# Patient Record
Sex: Female | Born: 1940
Health system: Southern US, Community
[De-identification: ages and names within clinical notes are randomized; demographics above are authoritative.]

## PROBLEM LIST (undated history)

## (undated) DIAGNOSIS — Z86010 Personal history of colonic polyps: Secondary | ICD-10-CM

## (undated) DIAGNOSIS — I10 Essential (primary) hypertension: Secondary | ICD-10-CM

## (undated) DIAGNOSIS — N840 Polyp of corpus uteri: Secondary | ICD-10-CM

## (undated) DIAGNOSIS — M1990 Unspecified osteoarthritis, unspecified site: Secondary | ICD-10-CM

## (undated) DIAGNOSIS — R002 Palpitations: Principal | ICD-10-CM

## (undated) DIAGNOSIS — C50919 Malignant neoplasm of unspecified site of unspecified female breast: Secondary | ICD-10-CM

## (undated) DIAGNOSIS — G4700 Insomnia, unspecified: Secondary | ICD-10-CM

## (undated) DIAGNOSIS — E785 Hyperlipidemia, unspecified: Secondary | ICD-10-CM

## (undated) DIAGNOSIS — Z923 Personal history of irradiation: Secondary | ICD-10-CM

## (undated) DIAGNOSIS — N3281 Overactive bladder: Secondary | ICD-10-CM

## (undated) DIAGNOSIS — M8580 Other specified disorders of bone density and structure, unspecified site: Secondary | ICD-10-CM

## (undated) DIAGNOSIS — N281 Cyst of kidney, acquired: Secondary | ICD-10-CM

## (undated) DIAGNOSIS — N764 Abscess of vulva: Secondary | ICD-10-CM

## (undated) DIAGNOSIS — T7840XA Allergy, unspecified, initial encounter: Secondary | ICD-10-CM

## (undated) HISTORY — DX: Allergy, unspecified, initial encounter: T78.40XA

## (undated) HISTORY — DX: Unspecified osteoarthritis, unspecified site: M19.90

## (undated) HISTORY — DX: Abscess of vulva: N76.4

## (undated) HISTORY — DX: Overactive bladder: N32.81

## (undated) HISTORY — DX: Palpitations: R00.2

## (undated) HISTORY — DX: Malignant neoplasm of unspecified site of unspecified female breast: C50.919

## (undated) HISTORY — DX: Polyp of corpus uteri: N84.0

## (undated) HISTORY — PX: POLYPECTOMY: SHX149

## (undated) HISTORY — DX: Hyperlipidemia, unspecified: E78.5

## (undated) HISTORY — DX: Other specified disorders of bone density and structure, unspecified site: M85.80

## (undated) HISTORY — PX: TONSILLECTOMY AND ADENOIDECTOMY: SUR1326

## (undated) HISTORY — DX: Personal history of colonic polyps: Z86.010

## (undated) HISTORY — DX: Essential (primary) hypertension: I10

## (undated) HISTORY — DX: Insomnia, unspecified: G47.00

## (undated) HISTORY — PX: BREAST BIOPSY: SHX20

## (undated) HISTORY — DX: Cyst of kidney, acquired: N28.1

## (undated) HISTORY — PX: COLONOSCOPY: SHX174

---

## 1992-06-19 DIAGNOSIS — C50919 Malignant neoplasm of unspecified site of unspecified female breast: Secondary | ICD-10-CM

## 1992-06-19 HISTORY — DX: Malignant neoplasm of unspecified site of unspecified female breast: C50.919

## 1992-06-19 HISTORY — PX: BREAST LUMPECTOMY: SHX2

## 1996-03-19 DIAGNOSIS — N764 Abscess of vulva: Secondary | ICD-10-CM

## 1996-03-19 HISTORY — DX: Abscess of vulva: N76.4

## 1998-02-23 ENCOUNTER — Emergency Department (HOSPITAL_COMMUNITY): Admission: EM | Admit: 1998-02-23 | Discharge: 1998-02-23 | Payer: Self-pay | Admitting: Emergency Medicine

## 1998-11-08 ENCOUNTER — Other Ambulatory Visit: Admission: RE | Admit: 1998-11-08 | Discharge: 1998-11-08 | Payer: Self-pay | Admitting: Obstetrics and Gynecology

## 1999-11-08 ENCOUNTER — Other Ambulatory Visit: Admission: RE | Admit: 1999-11-08 | Discharge: 1999-11-08 | Payer: Self-pay | Admitting: Obstetrics and Gynecology

## 2000-04-04 ENCOUNTER — Encounter (INDEPENDENT_AMBULATORY_CARE_PROVIDER_SITE_OTHER): Payer: Self-pay | Admitting: Specialist

## 2000-04-04 ENCOUNTER — Other Ambulatory Visit: Admission: RE | Admit: 2000-04-04 | Discharge: 2000-04-04 | Payer: Self-pay | Admitting: Obstetrics and Gynecology

## 2000-05-29 ENCOUNTER — Ambulatory Visit (HOSPITAL_COMMUNITY): Admission: RE | Admit: 2000-05-29 | Discharge: 2000-05-29 | Payer: Self-pay | Admitting: Obstetrics and Gynecology

## 2000-05-29 ENCOUNTER — Encounter: Payer: Self-pay | Admitting: Obstetrics and Gynecology

## 2000-08-17 DIAGNOSIS — N840 Polyp of corpus uteri: Secondary | ICD-10-CM

## 2000-08-17 HISTORY — DX: Polyp of corpus uteri: N84.0

## 2000-09-17 HISTORY — PX: HYSTEROSCOPY: SHX211

## 2000-09-17 HISTORY — PX: OTHER SURGICAL HISTORY: SHX169

## 2000-09-25 ENCOUNTER — Encounter (INDEPENDENT_AMBULATORY_CARE_PROVIDER_SITE_OTHER): Payer: Self-pay

## 2000-09-25 ENCOUNTER — Ambulatory Visit (HOSPITAL_COMMUNITY): Admission: RE | Admit: 2000-09-25 | Discharge: 2000-09-25 | Payer: Self-pay | Admitting: Obstetrics and Gynecology

## 2000-11-13 ENCOUNTER — Other Ambulatory Visit: Admission: RE | Admit: 2000-11-13 | Discharge: 2000-11-13 | Payer: Self-pay | Admitting: Obstetrics and Gynecology

## 2001-03-22 ENCOUNTER — Encounter: Payer: Self-pay | Admitting: Oncology

## 2001-03-22 ENCOUNTER — Ambulatory Visit (HOSPITAL_COMMUNITY): Admission: RE | Admit: 2001-03-22 | Discharge: 2001-03-22 | Payer: Self-pay | Admitting: Oncology

## 2001-05-30 ENCOUNTER — Ambulatory Visit (HOSPITAL_COMMUNITY): Admission: RE | Admit: 2001-05-30 | Discharge: 2001-05-30 | Payer: Self-pay | Admitting: Oncology

## 2001-05-30 ENCOUNTER — Encounter: Payer: Self-pay | Admitting: Oncology

## 2001-06-19 DIAGNOSIS — N281 Cyst of kidney, acquired: Secondary | ICD-10-CM

## 2001-06-19 HISTORY — DX: Cyst of kidney, acquired: N28.1

## 2001-07-25 ENCOUNTER — Encounter: Payer: Self-pay | Admitting: Internal Medicine

## 2002-01-09 ENCOUNTER — Other Ambulatory Visit: Admission: RE | Admit: 2002-01-09 | Discharge: 2002-01-09 | Payer: Self-pay | Admitting: Obstetrics and Gynecology

## 2002-04-16 ENCOUNTER — Encounter: Payer: Self-pay | Admitting: Oncology

## 2002-04-16 ENCOUNTER — Ambulatory Visit (HOSPITAL_COMMUNITY): Admission: RE | Admit: 2002-04-16 | Discharge: 2002-04-16 | Payer: Self-pay | Admitting: Oncology

## 2002-04-16 ENCOUNTER — Encounter (INDEPENDENT_AMBULATORY_CARE_PROVIDER_SITE_OTHER): Payer: Self-pay | Admitting: *Deleted

## 2002-06-02 ENCOUNTER — Encounter: Payer: Self-pay | Admitting: Oncology

## 2002-06-02 ENCOUNTER — Ambulatory Visit (HOSPITAL_COMMUNITY): Admission: RE | Admit: 2002-06-02 | Discharge: 2002-06-02 | Payer: Self-pay | Admitting: Oncology

## 2003-03-04 ENCOUNTER — Encounter: Admission: RE | Admit: 2003-03-04 | Discharge: 2003-03-04 | Payer: Self-pay | Admitting: Obstetrics and Gynecology

## 2003-03-04 ENCOUNTER — Encounter: Payer: Self-pay | Admitting: Obstetrics and Gynecology

## 2003-03-04 ENCOUNTER — Other Ambulatory Visit: Admission: RE | Admit: 2003-03-04 | Discharge: 2003-03-04 | Payer: Self-pay | Admitting: Obstetrics and Gynecology

## 2004-03-24 ENCOUNTER — Encounter: Admission: RE | Admit: 2004-03-24 | Discharge: 2004-03-24 | Payer: Self-pay | Admitting: Obstetrics and Gynecology

## 2004-06-03 ENCOUNTER — Other Ambulatory Visit: Admission: RE | Admit: 2004-06-03 | Discharge: 2004-06-03 | Payer: Self-pay | Admitting: Obstetrics and Gynecology

## 2004-08-17 ENCOUNTER — Ambulatory Visit: Payer: Self-pay | Admitting: Internal Medicine

## 2004-10-18 ENCOUNTER — Ambulatory Visit: Payer: Self-pay | Admitting: Internal Medicine

## 2004-10-27 ENCOUNTER — Ambulatory Visit: Payer: Self-pay | Admitting: Internal Medicine

## 2005-03-28 ENCOUNTER — Encounter: Admission: RE | Admit: 2005-03-28 | Discharge: 2005-03-28 | Payer: Self-pay | Admitting: Obstetrics and Gynecology

## 2005-08-01 ENCOUNTER — Other Ambulatory Visit: Admission: RE | Admit: 2005-08-01 | Discharge: 2005-08-01 | Payer: Self-pay | Admitting: Obstetrics & Gynecology

## 2006-04-06 ENCOUNTER — Encounter: Admission: RE | Admit: 2006-04-06 | Discharge: 2006-04-06 | Payer: Self-pay | Admitting: Obstetrics & Gynecology

## 2007-04-09 ENCOUNTER — Encounter: Admission: RE | Admit: 2007-04-09 | Discharge: 2007-04-09 | Payer: Self-pay | Admitting: Oncology

## 2007-04-24 ENCOUNTER — Other Ambulatory Visit: Admission: RE | Admit: 2007-04-24 | Discharge: 2007-04-24 | Payer: Self-pay | Admitting: Obstetrics & Gynecology

## 2007-04-29 ENCOUNTER — Encounter: Admission: RE | Admit: 2007-04-29 | Discharge: 2007-04-29 | Payer: Self-pay | Admitting: Obstetrics & Gynecology

## 2007-08-12 ENCOUNTER — Ambulatory Visit: Payer: Self-pay | Admitting: Internal Medicine

## 2007-08-20 ENCOUNTER — Ambulatory Visit: Payer: Self-pay | Admitting: Internal Medicine

## 2008-04-28 ENCOUNTER — Encounter: Admission: RE | Admit: 2008-04-28 | Discharge: 2008-04-28 | Payer: Self-pay | Admitting: Oncology

## 2009-02-18 ENCOUNTER — Telehealth: Payer: Self-pay | Admitting: Internal Medicine

## 2009-03-01 ENCOUNTER — Ambulatory Visit: Payer: Self-pay | Admitting: Internal Medicine

## 2009-03-01 DIAGNOSIS — C50919 Malignant neoplasm of unspecified site of unspecified female breast: Secondary | ICD-10-CM | POA: Insufficient documentation

## 2009-03-02 ENCOUNTER — Encounter: Payer: Self-pay | Admitting: Internal Medicine

## 2009-03-02 ENCOUNTER — Ambulatory Visit: Payer: Self-pay | Admitting: Internal Medicine

## 2009-03-04 ENCOUNTER — Encounter: Payer: Self-pay | Admitting: Internal Medicine

## 2009-05-03 ENCOUNTER — Encounter: Admission: RE | Admit: 2009-05-03 | Discharge: 2009-05-03 | Payer: Self-pay | Admitting: Oncology

## 2009-11-09 ENCOUNTER — Encounter: Admission: RE | Admit: 2009-11-09 | Discharge: 2009-11-09 | Payer: Self-pay | Admitting: Cardiology

## 2010-05-17 ENCOUNTER — Encounter: Admission: RE | Admit: 2010-05-17 | Discharge: 2010-05-17 | Payer: Self-pay | Admitting: Oncology

## 2010-06-06 ENCOUNTER — Ambulatory Visit: Payer: Self-pay | Admitting: Cardiology

## 2010-07-09 ENCOUNTER — Encounter: Payer: Self-pay | Admitting: Oncology

## 2010-10-07 ENCOUNTER — Other Ambulatory Visit: Payer: Self-pay

## 2010-11-04 NOTE — Op Note (Signed)
[facility]  Patient:    Jasmine Payne, Jasmine Payne                MRN: [id number] Proc. Date: 09/25/00 Adm. Date:  [id number] Attending:  Jenean Lindau                           Operative Report  PREOPERATIVE DIAGNOSIS:  Postmenopausal bleeding due to endometrial polyp.  POSTOPERATIVE DIAGNOSIS:  Postmenopausal bleeding due to endometrial polyp.  PROCEDURE:  Hysteroscopic resection.  SURGEON:  Laqueta Linden, M.D.  ANESTHESIA:  General endotracheal.  ESTIMATED BLOOD LOSS:  Less than 20 cc.  SORBITOL NET INTAKE:  120 cc.  COMPLICATIONS:  None.  INDICATIONS:  The patient is a 70 year old menopausal female with the recent onset of postmenopausal bleeding.  She is status post five years of Tamoxifen as chemotherapy after her lumpectomy for breast cancer.  She stopped her Tamoxifen in 1999.  She has been on no hormonal replacement therapy since. She underwent pelvic ultrasound and sonohistogram revealing a 1.7 cm endometrial polyp with an otherwise then endometrium.  She is therefore to undergo hysteroscope resection.  She has been extensively counseled as to the risks, benefits, alternatives, and complications, and agrees to proceed.  Full consent had been given.  DESCRIPTION OF PROCEDURE:  The patient was taken to the operating room and after proper identification and consents were ascertained, she was placed on the operating table in the supine position.  After the induction of general endotracheal anesthesia, she was placed in the [place] stirrups, and the perineum and vagina now were prepped and draped in a routine sterile fashion. A transurethral Foley was placed which was removed at the conclusion of the procedure.  Bimanual examination confirmed a retroverted normal size uterus. A speculum was placed in the vagina and the anterior lip of the cervix grasped with a single tooth tenaculum.  The uterine cavity sounded to 9 cm.  The internal os was  gently dilated to a #33 Pratt dilator.  The hysteroscope was video and continuous sorbitol infusion was then inserted.  The endocervical canal was free of lesions.  The endometrial cavity was atrophic with both tubal ostia well-visualized.  There was a large broad-based top protruding from the right aspect of the fundus.  The resectoscope was placed on routine settings and the polyp was systematically resected in multiple pieces which were evacuated and sent to pathology.  Several small bleeding points were cauterized.  Pre and post resection photographs were taken.  Sharp curettage was performed at the conclusion of the procedure and no tissue was obtained consistent with the patients extremely atrophic endometrium. Polyp pieces were sent to pathology.  All instruments were removed.  The tenaculum site was hemostatic.  Net sorbitol intake was 120 cc.  The patient was stable on transfer to the recovery room.  She will be observed at discharge per anesthesia protocol.  She is to follow up in the office for her annual exam in the end of May, and will reassess her progress at that point.  She is to call prior to her scheduled follow up for excessive pain, fever, bleeding, or other concerns.  She is to take her routine medications, and Advil or Aleve as needed for any cramping. DD:  09/25/00 TD:  09/25/00 Job: [id number]

## 2011-03-02 ENCOUNTER — Encounter: Payer: Self-pay | Admitting: *Deleted

## 2011-03-02 DIAGNOSIS — C50919 Malignant neoplasm of unspecified site of unspecified female breast: Secondary | ICD-10-CM | POA: Insufficient documentation

## 2011-03-02 DIAGNOSIS — C801 Malignant (primary) neoplasm, unspecified: Secondary | ICD-10-CM | POA: Insufficient documentation

## 2011-03-13 ENCOUNTER — Ambulatory Visit: Payer: Self-pay | Admitting: Cardiology

## 2011-03-13 ENCOUNTER — Other Ambulatory Visit: Payer: Self-pay | Admitting: *Deleted

## 2011-03-17 ENCOUNTER — Encounter: Payer: Self-pay | Admitting: Cardiology

## 2011-04-11 ENCOUNTER — Other Ambulatory Visit: Payer: Self-pay | Admitting: Oncology

## 2011-04-11 DIAGNOSIS — Z1231 Encounter for screening mammogram for malignant neoplasm of breast: Secondary | ICD-10-CM

## 2011-05-19 ENCOUNTER — Ambulatory Visit
Admission: RE | Admit: 2011-05-19 | Discharge: 2011-05-19 | Disposition: A | Discharge status: Home / Self Care | Payer: Medicare Other | Source: Ambulatory Visit | Attending: Oncology | Admitting: Oncology

## 2011-05-19 DIAGNOSIS — Z1231 Encounter for screening mammogram for malignant neoplasm of breast: Secondary | ICD-10-CM

## 2011-10-16 ENCOUNTER — Other Ambulatory Visit: Payer: Self-pay | Admitting: Dermatology

## 2011-12-04 ENCOUNTER — Other Ambulatory Visit: Payer: Self-pay | Admitting: Dermatology

## 2011-12-19 ENCOUNTER — Other Ambulatory Visit: Payer: Self-pay

## 2011-12-19 ENCOUNTER — Ambulatory Visit: Payer: Medicare Other | Admitting: Cardiology

## 2012-01-30 ENCOUNTER — Encounter: Payer: Self-pay | Admitting: Cardiology

## 2012-01-30 ENCOUNTER — Ambulatory Visit (INDEPENDENT_AMBULATORY_CARE_PROVIDER_SITE_OTHER): Payer: Medicare Other | Admitting: Cardiology

## 2012-01-30 ENCOUNTER — Other Ambulatory Visit (INDEPENDENT_AMBULATORY_CARE_PROVIDER_SITE_OTHER): Payer: Medicare Other

## 2012-01-30 VITALS — BP 122/64 | HR 57 | Ht 67.0 in | Wt 140.8 lb

## 2012-01-30 DIAGNOSIS — R0989 Other specified symptoms and signs involving the circulatory and respiratory systems: Secondary | ICD-10-CM

## 2012-01-30 DIAGNOSIS — E7800 Pure hypercholesterolemia, unspecified: Secondary | ICD-10-CM

## 2012-01-30 DIAGNOSIS — R079 Chest pain, unspecified: Secondary | ICD-10-CM

## 2012-01-30 LAB — LIPID PANEL
Cholesterol: 219 mg/dL — ABNORMAL HIGH (ref 0–200)
HDL: 79.1 mg/dL (ref >39.00)
Total CHOL/HDL Ratio: 3
Triglycerides: 70 mg/dL (ref 0.0–149.0)
VLDL: 14 mg/dL (ref 0.0–40.0)

## 2012-01-30 LAB — HEPATIC FUNCTION PANEL
ALT: 23 U/L (ref 0–35)
AST: 23 U/L (ref 0–37)
Albumin: 4.3 g/dL (ref 3.5–5.2)
Alkaline Phosphatase: 33 U/L — ABNORMAL LOW (ref 39–117)
Bilirubin, Direct: 0.2 mg/dL (ref 0.0–0.3)
Total Bilirubin: 1.5 mg/dL — ABNORMAL HIGH (ref 0.3–1.2)
Total Protein: 6.7 g/dL (ref 6.0–8.3)

## 2012-01-30 LAB — BASIC METABOLIC PANEL
BUN: 11 mg/dL (ref 6–23)
CO2: 22 mEq/L (ref 19–32)
Calcium: 8.9 mg/dL (ref 8.4–10.5)
Chloride: 104 mEq/L (ref 96–112)
Creatinine, Ser: 0.6 mg/dL (ref 0.4–1.2)
GFR: 104.81 mL/min (ref >60.00)
Glucose, Bld: 86 mg/dL (ref 70–99)
Potassium: 4.1 mEq/L (ref 3.5–5.1)
Sodium: 136 mEq/L (ref 135–145)

## 2012-01-30 LAB — LDL CHOLESTEROL, DIRECT: Direct LDL: 128.3 mg/dL

## 2012-01-30 NOTE — Progress Notes (Signed)
Jasmine Payne Date of Birth:  1940-12-25 [facility] [address] [place], [place]  [address] [phone]  Fax   [phone]  HPI: This pleasant 71 year old woman is seen after a long absence she has a history of atypical chest pain and a history of hypercholesterolemia.  We last saw her here in May 2011 when she came to the office self-referred.  We had seen her many years earlier but those records are no longer available.  In 2000 111 she was having some left-sided chest pain and a chest x-ray was unremarkable and she had a one-day treadmill Cardiolite stress test which was normal.  Current Outpatient Prescriptions  Medication Sig Dispense Refill  . Multiple Vitamin (MULTIVITAMIN) tablet Take 1 tablet by mouth daily.        . Red Yeast Rice Extract (RED YEAST RICE PO) Take by mouth 2 (two) times daily.        Allergies  Allergen Reactions  . Codeine     REACTION: Nausea    Patient Active Problem List  Diagnosis  . ADENOCARCINOMA, LEFT BREAST  . Cancer  . Breast cancer  . Pure hypercholesterolemia    History  Smoking status  . Former Smoker  Smokeless tobacco  . Not on file    History  Alcohol Use     Family History  Problem Relation Age of Onset  . Cancer Father     esophageal    Review of Systems: The patient denies any heat or cold intolerance.  No weight gain or weight loss.  The patient denies headaches or blurry vision.  There is no cough or sputum production.  The patient denies dizziness.  There is no hematuria or hematochezia.  The patient denies any muscle aches or arthritis.  The patient denies any rash.  The patient denies frequent falling or instability.  There is no history of depression or anxiety.  All other systems were reviewed and are negative.   Physical Exam: Filed Vitals:   01/30/12 0955  BP: 122/64  Pulse: 57   the general appearance feels a well-developed well-nourished woman in no distress.The head and neck  exam reveals pupils equal and reactive.  Extraocular movements are full.  There is no scleral icterus.  The mouth and pharynx are normal.  The neck is supple.  The carotids reveal no bruits.  The jugular venous pressure is normal.  The  thyroid is not enlarged.  There is no lymphadenopathy.  The chest is clear to percussion and auscultation.  There are no rales or rhonchi.  Expansion of the chest is symmetrical.  The precordium is quiet.  The first heart sound is normal.  The second heart sound is physiologically split.  There is no murmur gallop rub or click.  There is no abnormal lift or heave.  The abdomen is soft and nontender.  The bowel sounds are normal.  The liver and spleen are not enlarged.  There are no abdominal masses.  There are no abdominal bruits.  Extremities reveal good pedal pulses.  There is no phlebitis or edema.  There is no cyanosis or clubbing.  Strength is normal and symmetrical in all extremities.  There is no lateralizing weakness.  There are no sensory deficits.  The skin is warm and dry.  There is no rash.  EKG shows sinus bradycardia at 59 per minute with incomplete right bundle branch block and no ischemic changes.    Assessment / Plan: We are checking lab work  today.  She will be going on a trip to [place] in September.  We'll plan to see her in about 3 months for a followup visit and plan to repeat her lab work then.

## 2012-01-30 NOTE — Patient Instructions (Signed)
Will obtain labs today and call you with the results   Your physician recommends that you continue on your current medications as directed. Please refer to the Current Medication list given to you today.  Your physician recommends that you schedule a follow-up appointment in: 3 months with fasting labs (LP/BMET/HFP)

## 2012-01-30 NOTE — Assessment & Plan Note (Signed)
Patient has not been experiencing any recent or recurrent left chest pain.  She's had a previous normal Cardiolite in 2011.  She does have a positive family history of coronary disease with her mother having a heart attack in her 65s but is still alive at 31.  Other risk factors include the fact that the patient previously smoked and she has had hypercholesterolemia previous electrocardiograms have shown incomplete right bundle branch block.

## 2012-01-30 NOTE — Assessment & Plan Note (Signed)
The patient has a past history of hypercholesterolemia.  At the present time she is just taking red yeast rice in low dosage.  She is wondering whether she needs something stronger.  She is fasting today we will get fasting blood work and let her know about it.

## 2012-02-08 ENCOUNTER — Telehealth: Payer: Self-pay | Admitting: Cardiology

## 2012-02-08 DIAGNOSIS — E7800 Pure hypercholesterolemia, unspecified: Secondary | ICD-10-CM

## 2012-02-08 MED ORDER — ATORVASTATIN CALCIUM 10 MG PO TABS: 10.0000 mg | ORAL_TABLET | Freq: Every day | ORAL | Status: DC

## 2012-02-08 NOTE — Telephone Encounter (Signed)
Message copied by Burnell Blanks on Thu Feb 08, 2012  4:51 PM ------      Message from: Cassell Clement      Created: Wed Jan 31, 2012  4:16 PM       Please report.  The cholesterol is 219 in the LDL cholesterol which is the bad cholesterol is elevated at 128.  Her liver function studies are normal and her kidney function studies are normal.  I would like her to begin Lipitor and a low-dose of 10 mg daily.  We will plan to have her return in about 3 months for followup office visit and fasting lab work.

## 2012-02-08 NOTE — Telephone Encounter (Signed)
Advised patient

## 2012-02-08 NOTE — Telephone Encounter (Signed)
Pt would like lab results from last visit.

## 2012-04-08 ENCOUNTER — Other Ambulatory Visit: Payer: Self-pay | Admitting: Oncology

## 2012-04-08 DIAGNOSIS — Z1231 Encounter for screening mammogram for malignant neoplasm of breast: Secondary | ICD-10-CM

## 2012-05-01 ENCOUNTER — Ambulatory Visit (INDEPENDENT_AMBULATORY_CARE_PROVIDER_SITE_OTHER): Payer: Medicare Other | Admitting: Cardiology

## 2012-05-01 ENCOUNTER — Other Ambulatory Visit (INDEPENDENT_AMBULATORY_CARE_PROVIDER_SITE_OTHER): Payer: Medicare Other

## 2012-05-01 ENCOUNTER — Encounter: Payer: Self-pay | Admitting: Cardiology

## 2012-05-01 VITALS — BP 110/77 | HR 64 | Ht 66.0 in | Wt 143.8 lb

## 2012-05-01 DIAGNOSIS — R0989 Other specified symptoms and signs involving the circulatory and respiratory systems: Secondary | ICD-10-CM

## 2012-05-01 DIAGNOSIS — R079 Chest pain, unspecified: Secondary | ICD-10-CM

## 2012-05-01 DIAGNOSIS — E7800 Pure hypercholesterolemia, unspecified: Secondary | ICD-10-CM

## 2012-05-01 MED ORDER — ATORVASTATIN CALCIUM 10 MG PO TABS: ORAL_TABLET | ORAL | Status: DC

## 2012-05-01 NOTE — Assessment & Plan Note (Signed)
Patient has not been expressing any chest pain.  She has had some occasional sinus headaches relieved by aspirin.  She will have occasional migraine-like headaches associated with visual change.  She has not been experiencing any high blood pressure problems

## 2012-05-01 NOTE — Assessment & Plan Note (Signed)
She has hypercholesterolemia.  She had not actually started Lipitor until just a week ago.  She is taking 10 mg daily.  We will cancel the labs which we had planned to obtain today.  She would like to start taking Lipitor in a low dose of 10 mg every other day.  We will do that and then plan to recheck her in 3 months and get lab work before that next visit.

## 2012-05-01 NOTE — Patient Instructions (Addendum)
Your physician recommends that you schedule a follow-up appointment in: 3 months   Your physician recommends that you return for a FASTING lipid profile: 3 months   Your physician has recommended you make the following change in your medication:  lipitor 10 mg every other day.

## 2012-05-01 NOTE — Progress Notes (Signed)
Jasmine Payne Date of Birth:  [birth year] [facility] [address] [place], [place]  [address] [phone]  Fax   [phone]  HPI: This pleasant 71 year old woman is seen for a scheduled 3 month followup office visit .she has a history of atypical chest pain and a history of hypercholesterolemia. . We had seen her many years earlier but those records are no longer available. In 2000 111 she was having some left-sided chest pain and a chest x-ray was unremarkable and she had a one-day treadmill Cardiolite stress test which was normal.  On her last visit in August 2013 her lipids were elevated and we recommended starting Lipitor 10 mg a day.   Current Outpatient Prescriptions  Medication Sig Dispense Refill  . atorvastatin (LIPITOR) 10 MG tablet 10 mg every other day  30 tablet  5  . [DISCONTINUED] atorvastatin (LIPITOR) 10 MG tablet Take 1 tablet (10 mg total) by mouth daily.  30 tablet  5    Allergies  Allergen Reactions  . Codeine     REACTION: Nausea    Patient Active Problem List  Diagnosis  . ADENOCARCINOMA, LEFT BREAST  . Cancer  . Breast cancer  . Pure hypercholesterolemia  . Chest pain    History  Smoking status  . Former Smoker  Smokeless tobacco  . Not on file    History  Alcohol Use     Family History  Problem Relation Age of Onset  . Cancer Father     esophageal    Review of Systems: The patient denies any heat or cold intolerance.  No weight gain or weight loss.  The patient denies headaches or blurry vision.  There is no cough or sputum production.  The patient denies dizziness.  There is no hematuria or hematochezia.  The patient denies any muscle aches or arthritis.  The patient denies any rash.  The patient denies frequent falling or instability.  There is no history of depression or anxiety.  All other systems were reviewed and are negative.   Physical Exam: Filed Vitals:   05/01/12 1006  BP: 110/77  Pulse: 64   the  general appearance reveals a well-developed well-nourished woman in no distress.The head and neck exam reveals pupils equal and reactive.  Extraocular movements are full.  There is no scleral icterus.  The mouth and pharynx are normal.  The neck is supple.  The carotids reveal no bruits.  The jugular venous pressure is normal.  The  thyroid is not enlarged.  There is no lymphadenopathy.  The chest is clear to percussion and auscultation.  There are no rales or rhonchi.  Expansion of the chest is symmetrical.  The precordium is quiet.  The first heart sound is normal.  The second heart sound is physiologically split.  There is no murmur gallop rub or click.  There is no abnormal lift or heave.  The abdomen is soft and nontender.  The bowel sounds are normal.  The liver and spleen are not enlarged.  There are no abdominal masses.  There are no abdominal bruits.  Extremities reveal good pedal pulses.  There is no phlebitis or edema.  There is no cyanosis or clubbing.  Strength is normal and symmetrical in all extremities.  There is no lateralizing weakness.  There are no sensory deficits.  The skin is warm and dry.  There is no rash.      Assessment / Plan: Start Lipitor 10 mg every other day, intentionally low  at her request, and recheck in 3 months.  Get lab work before next visit

## 2012-05-20 ENCOUNTER — Ambulatory Visit
Admission: RE | Admit: 2012-05-20 | Discharge: 2012-05-20 | Disposition: A | Discharge status: Home / Self Care | Payer: Medicare Other | Source: Ambulatory Visit | Attending: Oncology | Admitting: Oncology

## 2012-05-20 DIAGNOSIS — Z1231 Encounter for screening mammogram for malignant neoplasm of breast: Secondary | ICD-10-CM

## 2012-07-29 ENCOUNTER — Other Ambulatory Visit: Payer: Medicare Other

## 2012-07-31 ENCOUNTER — Ambulatory Visit: Payer: Medicare Other | Admitting: Cardiology

## 2012-09-26 ENCOUNTER — Other Ambulatory Visit: Payer: Self-pay

## 2012-09-26 ENCOUNTER — Other Ambulatory Visit: Payer: Self-pay | Admitting: Obstetrics and Gynecology

## 2012-09-26 DIAGNOSIS — M8580 Other specified disorders of bone density and structure, unspecified site: Secondary | ICD-10-CM

## 2012-10-03 ENCOUNTER — Encounter: Payer: Self-pay | Admitting: *Deleted

## 2012-10-08 ENCOUNTER — Encounter: Payer: Self-pay | Admitting: Internal Medicine

## 2012-10-14 ENCOUNTER — Telehealth: Payer: Self-pay | Admitting: *Deleted

## 2012-10-14 ENCOUNTER — Other Ambulatory Visit: Payer: Self-pay | Admitting: Obstetrics and Gynecology

## 2012-10-14 ENCOUNTER — Ambulatory Visit: Payer: Medicare Other | Admitting: Internal Medicine

## 2012-10-14 DIAGNOSIS — Z01419 Encounter for gynecological examination (general) (routine) without abnormal findings: Secondary | ICD-10-CM | POA: Insufficient documentation

## 2012-10-14 NOTE — Telephone Encounter (Signed)
Jasmine Payne at  [facility] needing  Dr. Edward Jolly to sign ? Order in EPIC ? For Dexa Scan to be done tomorrow/ sue

## 2012-10-15 ENCOUNTER — Ambulatory Visit
Admission: RE | Admit: 2012-10-15 | Discharge: 2012-10-15 | Disposition: A | Discharge status: Home / Self Care | Payer: Medicare Other | Source: Ambulatory Visit | Attending: Obstetrics and Gynecology | Admitting: Obstetrics and Gynecology

## 2012-10-15 DIAGNOSIS — M8580 Other specified disorders of bone density and structure, unspecified site: Secondary | ICD-10-CM

## 2012-11-08 ENCOUNTER — Telehealth: Payer: Self-pay

## 2012-11-08 NOTE — Telephone Encounter (Signed)
5/23 [email or web address]//kn

## 2012-11-19 NOTE — Telephone Encounter (Signed)
Patient notified of BMD results. 

## 2013-01-23 ENCOUNTER — Ambulatory Visit: Payer: Self-pay | Admitting: Certified Nurse Midwife

## 2013-04-12 ENCOUNTER — Other Ambulatory Visit: Payer: Self-pay | Admitting: Obstetrics & Gynecology

## 2013-04-14 NOTE — Telephone Encounter (Signed)
Hi Joy,  I will approve the Ambien 10 mg, one po, q hs, prn.  Dispense #30.  Refill zero. I looks like you put in the order already.  Thanks!  [employer]

## 2013-04-14 NOTE — Telephone Encounter (Signed)
aex was 07-04-12 & no rx was given at that time. Last given 12-25-11 #30 with 2 refills. Her chart is in your door.Please approve or deny rx

## 2013-04-15 NOTE — Telephone Encounter (Signed)
Rx Printed will be faxed to Pharmacy

## 2013-04-21 ENCOUNTER — Other Ambulatory Visit: Payer: Self-pay

## 2013-04-21 DIAGNOSIS — Z1231 Encounter for screening mammogram for malignant neoplasm of breast: Secondary | ICD-10-CM

## 2013-05-22 ENCOUNTER — Ambulatory Visit
Admission: RE | Admit: 2013-05-22 | Discharge: 2013-05-22 | Disposition: A | Discharge status: Home / Self Care | Payer: Medicare Other | Source: Ambulatory Visit

## 2013-05-22 DIAGNOSIS — Z1231 Encounter for screening mammogram for malignant neoplasm of breast: Secondary | ICD-10-CM

## 2013-09-25 ENCOUNTER — Ambulatory Visit: Payer: Self-pay | Admitting: Obstetrics & Gynecology

## 2013-10-01 ENCOUNTER — Encounter: Payer: Self-pay | Admitting: Internal Medicine

## 2013-11-28 ENCOUNTER — Encounter: Payer: Self-pay | Admitting: Obstetrics & Gynecology

## 2013-12-01 ENCOUNTER — Telehealth: Payer: Self-pay | Admitting: Obstetrics & Gynecology

## 2013-12-01 ENCOUNTER — Ambulatory Visit: Payer: Self-pay | Admitting: Obstetrics & Gynecology

## 2013-12-01 NOTE — Telephone Encounter (Signed)
Patient called to cancel her AEX for today with Dr. Sabra Heck due to an emergency with her mom. Patient rescheduled for 12/18/13 with Dr. Sabra Heck. FYI only--no charge for missed appointment.

## 2013-12-18 ENCOUNTER — Encounter: Payer: Self-pay | Admitting: Obstetrics & Gynecology

## 2013-12-18 ENCOUNTER — Ambulatory Visit (INDEPENDENT_AMBULATORY_CARE_PROVIDER_SITE_OTHER): Payer: Medicare Other | Admitting: Obstetrics & Gynecology

## 2013-12-18 VITALS — BP 106/64 | HR 68 | Resp 20 | Ht 65.75 in | Wt 161.6 lb

## 2013-12-18 DIAGNOSIS — Z01419 Encounter for gynecological examination (general) (routine) without abnormal findings: Secondary | ICD-10-CM

## 2013-12-18 DIAGNOSIS — Z124 Encounter for screening for malignant neoplasm of cervix: Secondary | ICD-10-CM

## 2013-12-18 DIAGNOSIS — Z1211 Encounter for screening for malignant neoplasm of colon: Secondary | ICD-10-CM

## 2013-12-18 MED ORDER — ZOLPIDEM TARTRATE 10 MG PO TABS: 5.0000 mg | ORAL_TABLET | Freq: Every day | ORAL | Status: DC

## 2013-12-18 NOTE — Patient Instructions (Signed)

## 2013-12-18 NOTE — Progress Notes (Signed)
73 y.o. G2P2 DivorcedCaucasianF here for annual exam.  No vaginal bleeding.  Doing really well.  Dr. Virgina Jock is her PCP.  Working on weight loss.  Cholesterol is elevated.  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   Patient's last menstrual period was 06/19/1992.          Sexually active: No.  The current method of family planning is post menopausal status.    Exercising: Yes.    walking Smoker:  Former smoker  Health Maintenance: Pap:  04/18/10-WNL History of abnormal Pap:  no MMG:  05/22/13-normal Colonoscopy:  2008-repeat in 5 years, patient aware overdue BMD:   09/26/12, -1.7 worse t score TDaP:  2010 Screening Labs: PCP, Hb today: PCP, Urine today: PCP   reports that she has quit smoking. She has never used smokeless tobacco. She reports that she does not drink alcohol or use illicit drugs.  Past Medical History  Diagnosis Date  . Breast cancer 1994    left breast, lumpectomy/negative nodes/tamoxifen x 5 years  . Hyperlipidemia   . Vulvar abscess 10/97  . Hypertension   . Endometrial polyp 3/02  . Kidney cysts 2003    benign, fatty cyst/angiomyolipoma  . Osteopenia   . OAB (overactive bladder)   . Insomnia     Past Surgical History  Procedure Laterality Date  . Breast lumpectomy  1994    left side,radiation also  . Breast biopsy      right breast-benign growth  . Tonsillectomy and adenoidectomy    . Hysteroscopic resection  4/02    Current Outpatient Prescriptions  Medication Sig Dispense Refill  . Coenzyme Q10 (COQ10 PO) Take by mouth daily.      . Red Yeast Rice Extract (RED YEAST RICE PO) Take by mouth daily.      Marland Kitchen zolpidem (AMBIEN) 10 MG tablet TAKE 1 TABLET BY MOUTH AT BEDTIME AS NEEDED  30 tablet  0  . atorvastatin (LIPITOR) 10 MG tablet 10 mg every other day  30 tablet  5   No current facility-administered medications for this visit.    Family History  Problem Relation Age of Onset  . Esophageal cancer Father   . Ovarian cancer Sister   .  Colon cancer Mother   . Heart attack Sister   . Colon cancer Paternal Grandfather     ROS:  Pertinent items are noted in HPI.  Otherwise, a comprehensive ROS was negative.  Exam:   BP 106/64  Pulse 68  Resp 20  Ht 5' 5.75" (1.67 m)  Wt 161 lb 9.6 oz (73.301 kg)  BMI 26.28 kg/m2  LMP 06/19/1992  Weight change: +11#   Height: 5' 5.75" (167 cm)  Ht Readings from Last 3 Encounters:  12/18/13 5' 5.75" (1.67 m)  05/01/12 5\' 6"  (1.676 m)  01/30/12 5\' 7"  (1.702 m)    General appearance: alert, cooperative and appears stated age Head: Normocephalic, without obvious abnormality, atraumatic Neck: no adenopathy, supple, symmetrical, trachea midline and thyroid normal to inspection and palpation Lungs: clear to auscultation bilaterally Breasts: normal appearance, no masses or tenderness Heart: regular rate and rhythm Abdomen: soft, non-tender; bowel sounds normal; no masses,  no organomegaly Extremities: extremities normal, atraumatic, no cyanosis or edema Skin: Skin color, texture, turgor normal. No rashes or lesions Lymph nodes: Cervical, supraclavicular, and axillary nodes normal. No abnormal inguinal nodes palpated Neurologic: Grossly normal   Pelvic: External genitalia:  no lesions  Urethra:  normal appearing urethra with no masses, tenderness or lesions              Bartholins and Skenes: normal                 Vagina: normal appearing vagina with normal color and discharge, no lesions              Cervix: no lesions              Pap taken: Yes.   Bimanual Exam:  Uterus:  normal size, contour, position, consistency, mobility, non-tender              Adnexa: normal adnexa and no mass, fullness, tenderness               Rectovaginal: Confirms               Anus:  normal sphincter tone, no lesions  A:  Well Woman with normal exam H/O Breast cancer 1994--Lobar cancer Osteopenia OAB Insomnia  P:   Mammogram yearly.  3D recommended. pap smear today Colonoscopy  overdue.  Referral to Dr. Carlean Purl. Ambien 5mg  nightly prn.  #30/2RF.  Pt never calls to ask for RFs. Labs with Dr. Virgina Jock return annually or prn  An After Visit Summary was printed and given to the patient.

## 2013-12-23 ENCOUNTER — Telehealth: Payer: Self-pay | Admitting: Internal Medicine

## 2013-12-23 LAB — IPS PAP SMEAR ONLY

## 2013-12-23 NOTE — Telephone Encounter (Signed)
Patient is asking to switch from Dr. Olevia Perches to Dr. Carlean Purl. 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 Andreas Blower recommended Dr. Carlean Purl to her. Is this ok Dr. Olevia Perches?

## 2013-12-23 NOTE — Telephone Encounter (Signed)
OK to switch, she received colon recall letter  On 09/2013

## 2013-12-24 NOTE — Telephone Encounter (Signed)
Jasmine Payne, patient can switch to Dr. Carlean Purl. Both MD's have agreed. She can schedule with Dr. Carlean Purl.

## 2013-12-24 NOTE — Telephone Encounter (Signed)
OK 

## 2013-12-24 NOTE — Telephone Encounter (Signed)
Lm for pt to call to schedule colon with Dr. Carlean Purl

## 2013-12-24 NOTE — Telephone Encounter (Signed)
Patient wants to switch from Dr. Olevia Perches to Dr. Carlean Purl.  Dr. Olevia Perches states this is ok. Dr. Carlean Purl, will you accept patient?

## 2013-12-29 ENCOUNTER — Encounter: Payer: Self-pay | Admitting: Internal Medicine

## 2014-03-18 ENCOUNTER — Encounter: Payer: Medicare Other | Admitting: Internal Medicine

## 2014-04-10 ENCOUNTER — Ambulatory Visit (AMBULATORY_SURGERY_CENTER): Payer: Self-pay | Admitting: *Deleted

## 2014-04-10 VITALS — Ht 65.5 in | Wt 157.0 lb

## 2014-04-10 DIAGNOSIS — Z800 Family history of malignant neoplasm of digestive organs: Secondary | ICD-10-CM

## 2014-04-10 NOTE — Progress Notes (Signed)
Patient denies any allergies to eggs or soy. Patient denies any problems with anesthesia/sedation. Patient denies any oxygen use at home and does not take any diet/weight loss medications. EMMI education assisgned to patient on colonoscopy, this was explained and instructions given to patient. 

## 2014-04-16 ENCOUNTER — Encounter: Payer: Self-pay | Admitting: Internal Medicine

## 2014-04-22 ENCOUNTER — Other Ambulatory Visit: Payer: Self-pay

## 2014-04-22 DIAGNOSIS — Z1231 Encounter for screening mammogram for malignant neoplasm of breast: Secondary | ICD-10-CM

## 2014-05-07 ENCOUNTER — Encounter: Payer: Self-pay | Admitting: Internal Medicine

## 2014-05-07 ENCOUNTER — Ambulatory Visit (AMBULATORY_SURGERY_CENTER): Payer: Medicare Other | Admitting: Internal Medicine

## 2014-05-07 VITALS — BP 161/70 | HR 56 | Temp 96.8°F | Resp 16 | Ht 65.0 in | Wt 157.0 lb

## 2014-05-07 DIAGNOSIS — K635 Polyp of colon: Secondary | ICD-10-CM

## 2014-05-07 DIAGNOSIS — D123 Benign neoplasm of transverse colon: Secondary | ICD-10-CM

## 2014-05-07 DIAGNOSIS — D122 Benign neoplasm of ascending colon: Secondary | ICD-10-CM

## 2014-05-07 DIAGNOSIS — Z800 Family history of malignant neoplasm of digestive organs: Secondary | ICD-10-CM

## 2014-05-07 DIAGNOSIS — Z86010 Personal history of colonic polyps: Secondary | ICD-10-CM

## 2014-05-07 MED ORDER — SODIUM CHLORIDE 0.9 % IV SOLN: 500.0000 mL | INTRAVENOUS | Status: DC

## 2014-05-07 NOTE — Progress Notes (Signed)
Called to room to assist during endoscopic procedure.  Patient ID and intended procedure confirmed with present staff. Received instructions for my participation in the procedure from the performing physician.  

## 2014-05-07 NOTE — Progress Notes (Signed)
  [facility] Post-op Note  Patient: Jasmine Payne  Procedure(s) Performed: colonoscopy  Patient Location: [facility] - Recovery Area  Anesthesia Type: Deep Sedation/Propofol  Level of Consciousness: awake, oriented and patient cooperative  Airway and Oxygen Therapy: Patient Spontanous Breathing  Post-op Pain: none  Post-op Assessment:  Post-op Vital signs reviewed, Patient's Cardiovascular Status Stable, Respiratory Function Stable, Patent Airway, No signs of Nausea or vomiting and Pain level controlled  Post-op Vital Signs: Reviewed and stable  Complications: No apparent anesthesia complications  Mckenize Mezera E 10:42 AM

## 2014-05-07 NOTE — Patient Instructions (Addendum)
I found and removed 3 tiny polyps that look benign. It can still be reasonable not to repeat any more routine colonoscopies but I will send a letter after pathology reports in.  You also have a condition called diverticulosis - common and not usually a problem. Please read the handout provided.  I appreciate the opportunity to care for you. Gatha Mayer, MD, FACG   YOU HAD AN ENDOSCOPIC PROCEDURE TODAY AT [facility]: Refer to the procedure report that was given to you for any specific questions about what was found during the examination.  If the procedure report does not answer your questions, please call your gastroenterologist to clarify.  If you requested that your care partner not be given the details of your procedure findings, then the procedure report has been included in a sealed envelope for you to review at your convenience later.  YOU SHOULD EXPECT: Some feelings of bloating in the abdomen. Passage of more gas than usual.  Walking can help get rid of the air that was put into your GI tract during the procedure and reduce the bloating. If you had a lower endoscopy (such as a colonoscopy or flexible sigmoidoscopy) you may notice spotting of blood in your stool or on the toilet paper. If you underwent a bowel prep for your procedure, then you may not have a normal bowel movement for a few days.  DIET: Your first meal following the procedure should be a light meal and then it is ok to progress to your normal diet.  A half-sandwich or bowl of soup is an example of a good first meal.  Heavy or fried foods are harder to digest and may make you feel nauseous or bloated.  Likewise meals heavy in dairy and vegetables can cause extra gas to form and this can also increase the bloating.  Drink plenty of fluids but you should avoid alcoholic beverages for 24 hours.  ACTIVITY: Your care partner should take you home directly after the procedure.  You should plan to take it  easy, moving slowly for the rest of the day.  You can resume normal activity the day after the procedure however you should NOT DRIVE or use heavy machinery for 24 hours (because of the sedation medicines used during the test).    SYMPTOMS TO REPORT IMMEDIATELY: A gastroenterologist can be reached at any hour.  During normal business hours, 8:30 AM to 5:00 PM Monday through Friday, call [phone].  After hours and on weekends, please call the GI answering service at [phone] who will take a message and have the physician on call contact you.   Following lower endoscopy (colonoscopy or flexible sigmoidoscopy):  Excessive amounts of blood in the stool  Significant tenderness or worsening of abdominal pains  Swelling of the abdomen that is new, acute  Fever of 100F or higher  FOLLOW UP: If any biopsies were taken you will be contacted by phone or by letter within the next 1-3 weeks.  Call your gastroenterologist if you have not heard about the biopsies in 3 weeks.  Our staff will call the home number listed on your records the next business day following your procedure to check on you and address any questions or concerns that you may have at that time regarding the information given to you following your procedure. This is a courtesy call and so if there is no answer at the home number and we have not heard from you through  the emergency physician on call, we will assume that you have returned to your regular daily activities without incident.  SIGNATURES/CONFIDENTIALITY: You and/or your care partner have signed paperwork which will be entered into your electronic medical record.  These signatures attest to the fact that that the information above on your After Visit Summary has been reviewed and is understood.  Full responsibility of the confidentiality of this discharge information lies with you and/or your care-partner.

## 2014-05-07 NOTE — Op Note (Addendum)
[facility]  [address] [place], [id number]   COLONOSCOPY PROCEDURE REPORT  PATIENT: Jasmine Payne, Jasmine Payne  MR#: [id number] BIRTHDATE: April 02, 1941 , 73  yrs. old GENDER: female ENDOSCOPIST: Gatha Mayer, MD, [facility] PROCEDURE DATE:  05/07/2014 PROCEDURE:   Colonoscopy with biopsy First Screening Colonoscopy - Avg.  risk and is 50 yrs.  old or older - No.  Prior Negative Screening - Now for repeat screening. N/A  History of Adenoma - Now for follow-up colonoscopy & has been > or = to 3 yrs.  Yes hx of adenoma.  Has been 3 or more years since last colonoscopy.  Polyps Removed Today? Yes. ASA CLASS:   Class II INDICATIONS:surveillance colonoscopy based on a history of adenomatous colonic polyp(s) and patient's immediate family history of colon cancer. MEDICATIONS: Propofol 200 mg IV and Monitored anesthesia care  DESCRIPTION OF PROCEDURE:   After the risks benefits and alternatives of the procedure were thoroughly explained, informed consent was obtained.  The digital rectal exam revealed no abnormalities of the rectum.   The LB [id number]  endoscope was introduced through the anus and advanced to the cecum, which was identified by both the appendix and ileocecal valve. No adverse events experienced.   The quality of the prep was excellent, using MiraLax  The instrument was then slowly withdrawn as the colon was fully examined.  COLON FINDINGS: Three sessile polyps ranging from 2 to 69mm in size were found.  Polypectomies were performed with cold forceps.  The resection was complete, the polyp tissue was completely retrieved and sent to histology.   There was diverticulosis noted in the sigmoid colon.   The examination was otherwise normal.  Retroflexed rectal and right colon views revealed no abnormalities. The time to cecum=2 minutes 27 seconds.  Withdrawal time=13 minutes 12 seconds. The scope was withdrawn and the procedure completed. COMPLICATIONS: There were  no immediate complications.  ENDOSCOPIC IMPRESSION: 1.   Three sessile polyps ranging from 2 to 65mm in size were found; polypectomies were performed with cold forceps 2.   Diverticulosis was noted in the sigmoid colon 3.   The examination was otherwise normal - excellent +prep  RECOMMENDATIONS: 1.  Await pathology results 2.  May not need another routine colonoscopy given age.  eSigned:  Gatha Mayer, MD, [facility] 05/07/2014 10:45 AM Revised: 05/07/2014 10:45 AM  cc: The Patient  Edwinna Areola, MD and Shon Baton, MD

## 2014-05-08 ENCOUNTER — Telehealth: Payer: Self-pay | Admitting: *Deleted

## 2014-05-08 NOTE — Telephone Encounter (Signed)
  Follow up Call-  Call back number 05/07/2014  Post procedure Call Back phone  # [phone]  Permission to leave phone message Yes     Patient questions:  Do you have a fever, pain , or abdominal swelling? No. Pain Score  0 *  Have you tolerated food without any problems? Yes.    Have you been able to return to your normal activities? Yes.    Do you have any questions about your discharge instructions: Diet   No. Medications  No. Follow up visit  No.  Do you have questions or concerns about your Care? No.  Actions: * If pain score is 4 or above: No action needed, pain <4.

## 2014-05-11 ENCOUNTER — Other Ambulatory Visit: Payer: Self-pay | Admitting: Internal Medicine

## 2014-05-11 DIAGNOSIS — M8580 Other specified disorders of bone density and structure, unspecified site: Secondary | ICD-10-CM

## 2014-05-12 ENCOUNTER — Encounter: Payer: Self-pay | Admitting: Internal Medicine

## 2014-05-12 DIAGNOSIS — Z860101 Personal history of adenomatous and serrated colon polyps: Secondary | ICD-10-CM

## 2014-05-12 DIAGNOSIS — Z86010 Personal history of colonic polyps: Secondary | ICD-10-CM

## 2014-05-12 HISTORY — DX: Personal history of colonic polyps: Z86.010

## 2014-05-12 HISTORY — DX: Personal history of adenomatous and serrated colon polyps: Z86.0101

## 2014-05-12 NOTE — Progress Notes (Signed)
Quick Note:  2 adenomas and 1 mucosal polyp Consider repeat colonoscopy 2020 (will be 78) ______

## 2014-05-26 ENCOUNTER — Ambulatory Visit
Admission: RE | Admit: 2014-05-26 | Discharge: 2014-05-26 | Disposition: A | Discharge status: Home / Self Care | Payer: 59 | Source: Ambulatory Visit

## 2014-05-26 ENCOUNTER — Encounter (INDEPENDENT_AMBULATORY_CARE_PROVIDER_SITE_OTHER): Payer: Self-pay

## 2014-05-26 DIAGNOSIS — Z1231 Encounter for screening mammogram for malignant neoplasm of breast: Secondary | ICD-10-CM

## 2014-06-05 ENCOUNTER — Telehealth: Payer: Self-pay | Admitting: Obstetrics & Gynecology

## 2014-06-05 NOTE — Telephone Encounter (Signed)
Fine with appt.  Encounter closed.

## 2014-06-05 NOTE — Telephone Encounter (Signed)
Pt says she has seen blood in urine but she is not bleeding right now. Please call to advise.

## 2014-06-05 NOTE — Telephone Encounter (Signed)
Patient calling with request for office visit with Dr Sabra Heck to check ovaries. Patient reports about two weeks ago was traveling home from the beach and had lower pelvic pain and blood in urine. States "it hurt like crazy!" Patient states she had 4 episodes of hematuria. That day she took two doses of cipro (that she had on hand at home) and saw Dr. Virgina Jock the next day. At Dr. Keane Police office, patient reports no UTI but slight blood in urine.  Patient then saw Dr. Diona Fanti and had negative urine culture and ultrasound of kidney. Patient states she has a hx of L Kidney tumor that is benign.  Patient denies any further bleeding other than episode a few weeks ago.  Patient would like Dr. Sabra Heck to check her ovaries at this time to see if any correlation. Patient reports frequent urination but this is not unusual for her. Patient concerned as she states she had an aunt with ovarian cancer.  Offered patient office visit for evaluation. Patient agreeable but declines any earlier than after Christmas. Patient feels "this is not an emergency" and would like to wait until after Christmas to see Dr. Sabra Heck.   Appointment scheduled at patient's request for day 06/16/14 and advised would send to Dr. Sabra Heck for review and would return call if any new instructions from Dr. Sabra Heck.  Advised patient to call back if worsening of symptoms and would like earlier appointment, patient agreeable to this.

## 2014-06-16 ENCOUNTER — Ambulatory Visit (INDEPENDENT_AMBULATORY_CARE_PROVIDER_SITE_OTHER): Payer: Medicare Other | Admitting: Obstetrics & Gynecology

## 2014-06-16 ENCOUNTER — Encounter: Payer: Self-pay | Admitting: Obstetrics & Gynecology

## 2014-06-16 VITALS — BP 116/68 | HR 64 | Resp 20 | Wt 156.6 lb

## 2014-06-16 DIAGNOSIS — N939 Abnormal uterine and vaginal bleeding, unspecified: Secondary | ICD-10-CM

## 2014-06-16 DIAGNOSIS — R310 Gross hematuria: Secondary | ICD-10-CM

## 2014-06-16 NOTE — Progress Notes (Signed)
Subjective:     Patient ID: Jasmine Payne, female   DOB: 28-Oct-1940, 73 y.o.   MRN: [id number]  HPI 57 up G2P2 DWF here for having a single day of blood in urine.  She reports having this around Dec 14.  Pt was coming back from the beach and started having pressure and cramping.  Then the next day she noted blood in the urine.  She had three to four episodes of seeing blood in the urine.  She did not think it was in the vagina or rectal.  Pt had 2 old cipro and she took them.  She states she felt like the first one made her symptoms better.  She never needed anything else and the bleeding stopped.  Pt saw both Dr. Virgina Jock and Dr. Diona Fanti.  A repeat micro was negative.  Urine culture was negative.  He did not advise any other testing.  He did do a renal ultrasound.  Has a "fatty tumor" on the kidney.  Measured 3.9cm.  This is being followed yearly and pt has known about this for years.  Dr. Keane Police nurse advised she see me for examination, as well.     Had colonoscopy 05/07/14.  Had three polyps removed.    Review of Systems  All other systems reviewed and are negative.      Objective:   Physical Exam  Constitutional: She is oriented to person, place, and time. She appears well-developed and well-nourished.  Abdominal: Soft. Bowel sounds are normal. She exhibits no distension and no mass. There is no tenderness. There is no rebound and no guarding.  Genitourinary: Vagina normal and uterus normal. There is no rash, tenderness or lesion on the right labia. There is no rash, tenderness or lesion on the left labia. Right adnexum displays no mass, no tenderness and no fullness. Left adnexum displays no mass, no tenderness and no fullness. No tenderness in the vagina. No vaginal discharge found.  Lymphadenopathy:       Right: No inguinal adenopathy present.       Left: No inguinal adenopathy present.  Neurological: She is alert and oriented to person, place, and time.  Skin: Skin is warm and dry.   Psychiatric: She has a normal mood and affect.       Assessment:     Single episode of macroscopic hematuria.  Seen by Dr.  Diona Fanti with renal ultrasound and fatty tumor on kidney measuring 3.9cm Pt pretty sure this wasn't vaginal bleeding and exam is normal today    Plan:     Urine microscopic Plan PUS and endometrial biopsy if endometrium is >44mm Pt is aware if this happens again, she needs to be seen again.  I would encourage cystoscopy and CT scan at that point.

## 2014-06-17 LAB — URINALYSIS, MICROSCOPIC ONLY
Bacteria, UA: NONE SEEN
Casts: NONE SEEN
Crystals: NONE SEEN
Squamous Epithelial / LPF: NONE SEEN

## 2014-06-22 ENCOUNTER — Telehealth: Payer: Self-pay | Admitting: Emergency Medicine

## 2014-06-22 ENCOUNTER — Telehealth: Payer: Self-pay | Admitting: Obstetrics & Gynecology

## 2014-06-22 NOTE — Telephone Encounter (Signed)
Left message for patient to call back. Need to go over PUS benefit. Pr $100 copay

## 2014-06-22 NOTE — Telephone Encounter (Signed)
Patient notified of results. Verbalized understanding. Scheduled for Pelvic ultrasound  With Dr. Sabra Heck 07/02/14.  Routing to provider for final review. Patient agreeable to disposition. Will close encounter

## 2014-06-22 NOTE — Telephone Encounter (Signed)
-----   Message from Lyman Speller, MD sent at 06/17/2014  5:32 PM EST ----- Please inform pt urine micro was negative for blood here as well.  She is going to call if this happens again and is going to come back for PUS.

## 2014-07-01 NOTE — Telephone Encounter (Signed)
Spoke with patient. Advised that per benefit quote received, she will be responsible for a $100 copay when she comes in for PUS. Patient agreeable. Also advised that there is an outstanding balance of $35 that will need to be collected at her visit as well. Patient agreeable.

## 2014-07-02 ENCOUNTER — Other Ambulatory Visit: Payer: Medicare Other

## 2014-07-02 ENCOUNTER — Other Ambulatory Visit: Payer: Medicare Other | Admitting: Obstetrics & Gynecology

## 2014-07-02 ENCOUNTER — Telehealth: Payer: Self-pay | Admitting: Obstetrics & Gynecology

## 2014-07-02 NOTE — Telephone Encounter (Signed)
I recommended the ultrasound because her bleeding was significant and I really feel it is important that I evaluate the endometrium, either through biopsy or ultrasound.

## 2014-07-02 NOTE — Telephone Encounter (Signed)
Patient returned call. States that she feels fine and wants to ask Dr Sabra Heck if PUS is necessary since she feels well. Passed call to triage.

## 2014-07-02 NOTE — Telephone Encounter (Signed)
Call to patient to reschedule PUS. Left message for patient to call back.

## 2014-07-02 NOTE — Telephone Encounter (Signed)
Pus/Consult appointment needs to be rescheduled due to emergency with ultrasound tech. Patient aware someone will call her later to rs.

## 2014-07-02 NOTE — Telephone Encounter (Signed)
Patient states she has pushed a trip off to have PUS done in office. Will not be back in town for two weeks. Apologized for need to reschedule due to emergency. "I have not had any problems since I was seen. I have not had any pain or any bleeding. I just think maybe I can wait and if I have any more problems when I could have the ultrasound. I really want Dr.Miller's recommendation before I reschedule." Advised I will need to speak with Dr.Miller and return call with further recommendations. Patient is agreeable. Patient was last seen on 06/16/14. Had experienced blood in urine with cramping and pressure on 12/14. Saw Dr.Russo and Dr.Dahlstedt. Urine micro was negative. Was encouraged to be seen with our practice for evaluation as well.

## 2014-07-03 NOTE — Telephone Encounter (Signed)
Spoke with patient. Advised patient of message as seen below from [place]. Patient is agreeable. Requesting to reschedule after 1/26 due to traveling. Ultrasound rescheduled for 1/28 at 2pm with 2:30pm consult with Dr.Miller. Agreeable to date and time.  Routing to provider for final review. Patient agreeable to disposition. Will close encounter

## 2014-07-16 ENCOUNTER — Ambulatory Visit (INDEPENDENT_AMBULATORY_CARE_PROVIDER_SITE_OTHER): Payer: Medicare Other | Admitting: Obstetrics & Gynecology

## 2014-07-16 ENCOUNTER — Ambulatory Visit (INDEPENDENT_AMBULATORY_CARE_PROVIDER_SITE_OTHER): Payer: Medicare Other

## 2014-07-16 VITALS — BP 138/82 | Ht 65.75 in | Wt 159.0 lb

## 2014-07-16 DIAGNOSIS — R938 Abnormal findings on diagnostic imaging of other specified body structures: Secondary | ICD-10-CM

## 2014-07-16 DIAGNOSIS — R9389 Abnormal findings on diagnostic imaging of other specified body structures: Secondary | ICD-10-CM

## 2014-07-16 DIAGNOSIS — N939 Abnormal uterine and vaginal bleeding, unspecified: Secondary | ICD-10-CM

## 2014-07-16 NOTE — Progress Notes (Signed)
74 y.o. G2P2 Divorced Jasmine Payne female here for a pelvic ultrasound due to episode in late December of bright red bleeding, that was thought to be urinary in nature.  However, pt was never completely sure and urine culture was negative.  Renal ultrasound was done once she saw Dr. Diona Fanti.  She also saw Dr. Virgina Jock, her PCP.  She had two old Cipro pills that she took around that time as well.  Due to not being completely sure of source of bleeding, PUS recommended.    Patient's last menstrual period was 06/19/1992.  FINDINGS: UTERUS: 6 x 4 x 2.8cm EMS: from 2.7- 5.27mm with cystic spaces ADNEXA:   Left ovary 1.0 x 1.0 x 0.6cm   Right ovary 2.1 x 1.0 x 1.4cm CUL DE SAC:  No free fluid noted  Images reviewed with pt.  Due to thickened endometrium, endometrial biopsy recommended.  Pt consents to proceeding.  Verbal and written consent obtained.    Procedure:  Speculum placed.  Cervix visualized and cleansed with betadine prep.  A single toothed tenaculum was applied to the anterior lip of the cervix.  Endometrial pipelle was advanced through the cervix into the endometrial cavity without difficulty.  Pipelle passed to 5.5cm.  Suction applied and pipelle removed with scant tissue but large amount of mucous noted.  Repeat sample obtained and, again, scant specimen noted.  Tenculum removed.  No bleeding noted.  Patient tolerated procedure well.  Assessment:  Episode of bleeding, urinary source not 100% sure Thickened endometrium on ultrasound  Plan: Endometrial biopsy pending Pt clearly aware if bleeding happens again, I want to know as I think she needs a cysto at that time.  ~15 minutes spent with patient >50% of time was in face to face discussion of above before endometrial biopsy was performed.

## 2014-07-17 ENCOUNTER — Encounter: Payer: Self-pay | Admitting: Obstetrics & Gynecology

## 2014-07-22 ENCOUNTER — Telehealth: Payer: Self-pay | Admitting: Obstetrics & Gynecology

## 2014-07-22 NOTE — Telephone Encounter (Signed)
Patient called and said, "I'd like to speak with the nurse because I had a tinge of blood in my urine this morning and I am concerned." Patient calling to check on the status of her recent biopsy results as well.

## 2014-07-22 NOTE — Telephone Encounter (Signed)
Spoke with patient.  She states this morning she was straining with a bowel movement and noted a small amount of blood on tissue after wiping. Patient feels this came from urethra and not rectal area.  Patient denies pain with urination. Denies any complaints.  This occurred one time only this morning.  Advised patient will send message to Dr. Sabra Heck to review and advise. Advised patient awaiting results of Endometrial biopsy. Will call back with message from Dr. Sabra Heck and results of Endometrial biopsy.  Patient is also going to contact Dr. Alan Ripper office to discuss and obtain appointment.   Routing to Dr. Sabra Heck and Dr. Quincy Simmonds as covering provider.

## 2014-07-22 NOTE — Telephone Encounter (Signed)
Patient returned call. Message from Dr. Sabra Heck given.   Patient agreeable with plan.  [facility], spoke with Triage Nurse: Shirlean Mylar. Charma Igo that patient has appointment for annual exam with Dr. Diona Fanti on 08/04/13 at 130.  She requested that office visit notes from Dr. Sabra Heck be sent to Dr. Erma Heritage attention and he can add on procedure to appointment as necessary.   Patient is given this appointment information from [facility]. She will call to confirm information as well. Advised patient to request that records be sent back to Dr. Sabra Heck for follow up. She verbalized understanding and will return call with any issues.   Office visit notes and biopsy results sent to [facility] at [phone] attention Dr. Diona Fanti. Fax confirmation received.    Routing to provider for final review. Patient agreeable to disposition. Will close encounter

## 2014-07-22 NOTE — Telephone Encounter (Signed)
Message left to return call to Jasmine Payne at [phone].    

## 2014-07-22 NOTE — Telephone Encounter (Signed)
Please let pt know biopsy did not show any abnormal cells but the specimen was very small.  She was quite uncomfortable with biopsy.  I do think she needs a cystoscopy and please make sure she had the results sent to me.  If that is negative, i want to do a D&C.  I know she thinks this is all overkill but she should not have bleeding without a known source and she knows I am anxious that a cause has not been discovered yet.  Thanks.

## 2014-07-22 NOTE — Telephone Encounter (Signed)
Chart reviewed.  Dr. Sabra Heck would like for the patient to see urology for a cystoscopy if she has any further blood in the urine or coming from the urethra.   Dr. Sabra Heck will need to review the pathology report and make recommendations to the patient.  The tissue specimen was very scanty  Cc- Dr. Sabra Heck

## 2014-08-04 ENCOUNTER — Telehealth: Payer: Self-pay | Admitting: Obstetrics & Gynecology

## 2014-08-04 NOTE — Telephone Encounter (Signed)
Patient wants to talk with nurse she has some questions. No information given.

## 2014-08-04 NOTE — Telephone Encounter (Signed)
Spoke with patient. Patient states that she is having "vaginal burning" after urination. "It is not when I use the bathroom but when I am done. I make sure I am really dry and then when I go lay back down it starts burning." Patient denies any urinary symptoms. Patient had urine checked at doctors appointment yesterday which was negative. Patient has tried coconut oil with no relief. Was prescribed alavista which has not provided relief. Advised patient will need to be seen for evaluation. Offered appointment today but patient declines due to being out of town. Appointment scheduled for tomorrow at 11:15am with Jasmine Payne CNM. Agreeable to date and time.  Routing to provider for final review. Patient agreeable to disposition. Will close encounter

## 2014-08-05 ENCOUNTER — Ambulatory Visit (INDEPENDENT_AMBULATORY_CARE_PROVIDER_SITE_OTHER): Payer: Medicare Other | Admitting: Certified Nurse Midwife

## 2014-08-05 ENCOUNTER — Encounter: Payer: Self-pay | Admitting: Certified Nurse Midwife

## 2014-08-05 VITALS — BP 110/72 | HR 72 | Resp 16 | Ht 65.75 in | Wt 158.0 lb

## 2014-08-05 DIAGNOSIS — N952 Postmenopausal atrophic vaginitis: Secondary | ICD-10-CM

## 2014-08-05 NOTE — Patient Instructions (Signed)

## 2014-08-05 NOTE — Progress Notes (Signed)
74 y.o. divorced white female g2p2002 here with complaint of vaginal symptoms of burning when urine touches skin.Onset of symptoms 2 days ago. Denies new personal products. Patient was also seen by urologist yesterday for urine check and normal. Patient feels like she has "fixed the problem now. Applied some antibiotic ointment to area and it stopped.Not sexually active, ? Vaginal dryness.  Urinary symptoms none .   O:Healthy female WDWN Affect: normal, orientation x 3  Exam: Skin warm and dry Lymph node: no enlargement or tenderness Pelvic exam: External genital: atrophic normal appearance, inside vulva area red along labia area and at introitus, wet prep taken, no lesions, patient identifies this as the area of burning on skin. BUS: negative Vagina: scant discharge noted. Ph:4.0   ,Wet prep taken Cervix: normal, non tender Declines pelvic exam  Wet Prep results: negative for pathogens   A:Atrophic vaginitis/vulvitis   P:Discussed findings of atrophic vaginitis and etiology. Discussed Aveeno or baking soda sitz bath for comfort. Avoid moist clothes or pads for extended period of time. If working out in gym clothes or swim suits for long periods of time change underwear or bottoms of swimsuit if possible. Olive Oil/ or coconut oil use for skin protection prior to activity can be used to external skin. Discussed management with OTC products due history of breast cancer. Discussed coconut oil to area with instructions daily to hydrate and protect skin. Shown area in mirror of concern and patient appreciative. Discussed patting with cleaning after urination, and avoid constant wiping to traumatize skin. Questions addressed.  Rv prn

## 2014-08-09 NOTE — Progress Notes (Signed)
Reviewed personally.  M. Suzanne Keishla Oyer, MD.  

## 2014-09-08 ENCOUNTER — Other Ambulatory Visit: Payer: Self-pay | Admitting: Obstetrics & Gynecology

## 2014-09-09 NOTE — Telephone Encounter (Signed)
Rx faxed to [facility] 

## 2014-09-09 NOTE — Telephone Encounter (Signed)
Medication refill request: Ambien 10 mg Last AEX:  12/18/13 SM Next AEX: 01/05/15 SM Last MMG (if hormonal medication request): 05/27/14 [id number]:Neg Refill authorized: 12/18/13 #30/2R. Today please advise.

## 2015-01-05 ENCOUNTER — Encounter: Payer: Self-pay | Admitting: Obstetrics & Gynecology

## 2015-01-05 ENCOUNTER — Ambulatory Visit (INDEPENDENT_AMBULATORY_CARE_PROVIDER_SITE_OTHER): Payer: Medicare Other | Admitting: Obstetrics & Gynecology

## 2015-01-05 VITALS — BP 104/66 | HR 60 | Resp 20 | Ht 65.5 in | Wt 157.0 lb

## 2015-01-05 DIAGNOSIS — Z01419 Encounter for gynecological examination (general) (routine) without abnormal findings: Secondary | ICD-10-CM

## 2015-01-05 NOTE — Progress Notes (Signed)
74 y.o. G2P2 DivorcedCaucasianF here for annual exam.  Doing well.  Pt has not had any issues with vaginal bleeding/urinary bleeding/rectal bleeding since last visit.  Did follow up with Dr. Daiva Huge but has not had a colonoscopy.  Had episode of bleeding in December.  PCP:  Dr. Virgina Jock.  Has lab work on Thursday.  Will see Dr. Virgina Jock the following week.     Patient's last menstrual period was 06/19/1992.          Sexually active: No.  The current method of family planning is post menopausal status.    Exercising: Yes.    walking Smoker:  Former smoker  Health Maintenance: Pap:  12/18/13 WNL History of abnormal Pap:  no MMG:  05/26/14 3D-normal Colonoscopy:  2015-repeat in 5 years BMD:   10/15/12-osteopenia in spine, normal in hip TDaP:  2016 Screening Labs: PCP, Hb today: PCP, Urine today: PCP   reports that she has quit smoking. She has never used smokeless tobacco. She reports that she does not drink alcohol or use illicit drugs.  Past Medical History  Diagnosis Date  . Breast cancer 1994    left breast, lumpectomy/negative nodes/tamoxifen x 5 years  . Hyperlipidemia   . Vulvar abscess 10/97  . Endometrial polyp 3/02  . Kidney cysts 2003    benign, fatty cyst/angiomyolipoma  . Osteopenia   . OAB (overactive bladder)   . Insomnia   . Hypertension     no meds., pt denies  . Hx of adenomatous colonic polyps 05/12/2014    Past Surgical History  Procedure Laterality Date  . Breast lumpectomy  1994    left side,radiation also  . Breast biopsy      right breast-benign growth  . Tonsillectomy and adenoidectomy    . Hysteroscopic resection  4/02    Current Outpatient Prescriptions  Medication Sig Dispense Refill  . Ascorbic Acid (VITAMIN C PO) Take 1 tablet by mouth daily. As needed    . aspirin 325 MG tablet Take 325 mg by mouth as needed.    Marland Kitchen BOOSTRIX 5-2.5-18.5 injection ADM 0.5ML IM UTD  0  . Coenzyme Q10 (COQ10 PO) Take by mouth daily.    Marland Kitchen ibuprofen (ADVIL,MOTRIN) 200  MG tablet Take 200 mg by mouth every 6 (six) hours as needed.    . Red Yeast Rice Extract (RED YEAST RICE PO) Take by mouth daily.    Marland Kitchen zolpidem (AMBIEN) 10 MG tablet TAKE ONE-HALF TABLET AT BEDTIME 30 tablet 1   No current facility-administered medications for this visit.    Family History  Problem Relation Age of Onset  . Esophageal cancer Father   . Ovarian cancer Sister   . Colon cancer Mother 39  . Heart attack Sister   . Colon cancer Paternal Grandfather   . Irritable bowel syndrome Maternal Grandmother     ROS:  Pertinent items are noted in HPI.  Otherwise, a comprehensive ROS was negative.  Exam:   BP 104/66 mmHg  Pulse 60  Resp 20  Ht 5' 5.5" (1.664 m)  Wt 157 lb (71.215 kg)  BMI 25.72 kg/m2  LMP 06/19/1992  Height: 5' 5.5" (166.4 cm)  Ht Readings from Last 3 Encounters:  01/05/15 5' 5.5" (1.664 m)  08/05/14 5' 5.75" (1.67 m)  07/16/14 5' 5.75" (1.67 m)    General appearance: alert, cooperative and appears stated age Head: Normocephalic, without obvious abnormality, atraumatic Neck: no adenopathy, supple, symmetrical, trachea midline and thyroid normal to inspection and palpation Lungs: clear to  auscultation bilaterally Breasts: normal appearance, no masses or tenderness Heart: regular rate and rhythm Abdomen: soft, non-tender; bowel sounds normal; no masses,  no organomegaly Extremities: extremities normal, atraumatic, no cyanosis or edema Skin: Skin color, texture, turgor normal. No rashes or lesions Lymph nodes: Cervical, supraclavicular, and axillary nodes normal. No abnormal inguinal nodes palpated Neurologic: Grossly normal   Pelvic: External genitalia:  no lesions              Urethra:  normal appearing urethra with no masses, tenderness or lesions              Bartholins and Skenes: normal                 Vagina: normal appearing vagina with normal color and discharge, no lesions              Cervix: no lesions              Pap taken: No. Bimanual  Exam:  Uterus:  normal size, contour, position, consistency, mobility, non-tender              Adnexa: normal adnexa and no mass, fullness, tenderness               Rectovaginal: Confirms               Anus:  normal sphincter tone, no lesions  Chaperone was present for exam.  A:  Well Woman with normal exam H/O Breast cancer 1994--Lobar cancer Osteopenia OAB Insomnia  P: Mammogram yearly. 3D recommended. pap smear 2015.  No pap obtained today. Ambien 5mg  nightly prn. Doesn't need RFs now.  She will calls when she needs it. Labs with Dr. Virgina Jock on thursday return annually or prn

## 2015-04-21 ENCOUNTER — Other Ambulatory Visit: Payer: Self-pay

## 2015-04-21 DIAGNOSIS — Z1231 Encounter for screening mammogram for malignant neoplasm of breast: Secondary | ICD-10-CM

## 2015-05-27 ENCOUNTER — Other Ambulatory Visit: Payer: Self-pay | Admitting: Obstetrics & Gynecology

## 2015-05-28 NOTE — Telephone Encounter (Signed)
Rx printed and faxed to [facility] [address].

## 2015-05-28 NOTE — Telephone Encounter (Signed)
Medication refill request: Ambien 10mg   Last AEX:  01/05/2015 SM Next AEX: 04/06/2016 SM Last MMG (if hormonal medication request):05/26/2014 BIRADS 1 Next MMG: 06/02/2015  Refill authorized: 09/09/2014 Ambien #30 tabs 1 Refill  Today: #30 Tabs 1 Refill ?

## 2015-06-02 ENCOUNTER — Ambulatory Visit
Admission: RE | Admit: 2015-06-02 | Discharge: 2015-06-02 | Disposition: A | Discharge status: Home / Self Care | Payer: Medicare Other | Source: Ambulatory Visit

## 2015-06-02 DIAGNOSIS — Z1231 Encounter for screening mammogram for malignant neoplasm of breast: Secondary | ICD-10-CM

## 2015-06-03 ENCOUNTER — Other Ambulatory Visit: Payer: Self-pay | Admitting: Internal Medicine

## 2015-06-03 DIAGNOSIS — R928 Other abnormal and inconclusive findings on diagnostic imaging of breast: Secondary | ICD-10-CM

## 2015-06-09 ENCOUNTER — Ambulatory Visit
Admission: RE | Admit: 2015-06-09 | Discharge: 2015-06-09 | Disposition: A | Discharge status: Home / Self Care | Payer: Medicare Other | Source: Ambulatory Visit | Attending: Internal Medicine | Admitting: Internal Medicine

## 2015-06-09 DIAGNOSIS — R928 Other abnormal and inconclusive findings on diagnostic imaging of breast: Secondary | ICD-10-CM

## 2015-06-11 ENCOUNTER — Telehealth: Payer: Self-pay | Admitting: Cardiovascular Disease

## 2015-06-11 NOTE — Telephone Encounter (Signed)
Received records from [facility] for appointment on 07/01/15 with Dr Oval Linsey.  Records given to [facility] (medical records) for Dr Blenda Mounts schedule. lp

## 2015-06-24 ENCOUNTER — Ambulatory Visit: Payer: Medicare Other | Admitting: Physician Assistant

## 2015-07-01 ENCOUNTER — Encounter: Payer: Self-pay | Admitting: Cardiovascular Disease

## 2015-07-01 ENCOUNTER — Ambulatory Visit (INDEPENDENT_AMBULATORY_CARE_PROVIDER_SITE_OTHER): Payer: Medicare Other | Admitting: Cardiovascular Disease

## 2015-07-01 VITALS — BP 108/76 | HR 69 | Ht 66.0 in | Wt 160.0 lb

## 2015-07-01 DIAGNOSIS — R002 Palpitations: Secondary | ICD-10-CM | POA: Insufficient documentation

## 2015-07-01 DIAGNOSIS — E785 Hyperlipidemia, unspecified: Secondary | ICD-10-CM | POA: Diagnosis not present

## 2015-07-01 DIAGNOSIS — R072 Precordial pain: Secondary | ICD-10-CM

## 2015-07-01 HISTORY — DX: Palpitations: R00.2

## 2015-07-01 MED ORDER — ROSUVASTATIN CALCIUM 5 MG PO TABS: 5.0000 mg | ORAL_TABLET | ORAL | Status: DC

## 2015-07-01 NOTE — Progress Notes (Signed)
Cardiology Office Note   Date:  07/01/2015   ID:  Jasmine Payne, DOB Aug 20, 1940, MRN [id number]  PCP:  [name], MD  Cardiologist:   Jasmine Harness, MD   Chief Complaint  Patient presents with  . Chest Pain  . New Patient (Initial Visit)      History of Present Illness: Jasmine Payne is a 75 y.o. female with breast cancer s/p lumpectomy and radiation and hyperlipidemia who presents for an evaluation of chest pain.  Jasmine Payne has noted periodic chest pain that occasionally gets very intense. In November she had an episode that was particularly intense when walking on the beach.   She developed 7/10 substernal chest pain.  At that time she also noted fluttering in her chest.  She felt as though she might pass out. She denies shortness of breath but did feel as though she was breathing cold air.  She also denies any nausea or diaphoresis.  The episode lasted for approximately 10 minutes and improved with rest.  Since that time she's had several episodes that were less severe.  She's had times when the chest pain occurs without any associated palpitations.  She also notes that she sometimes has palpitations when laying in bed at night.  Jasmine Payne saw her PCP, Jasmine Payne, on 06/08/15.  At that time she reported chest pain and palpitaitons.  EKG was unremarkable.  She war referred to cardiology for evaluation and instructed to limit her caffeine intake.  She was prescribed Nexium but has not been taking it.  Jasmine Payne has been reluctant to try statins.  She was previously on atorvastatin but developed a metallic taste.  She has been taking red yeast rice and her lipids have not improved much.  Jasmine Payne does not    She has been taking red yeast rice for hyperlipidemia   She has eliminated coffee which has helped.  The episode lasted for approximately 10 minute and improved with rest.  There was no associated shortness of breath, nausea or diaphoresis.   Prior to this she  was drinking 2 espressos every morning.  She has also cut back on using Ambien.   She has a lot of stress. Since seeing Jasmine Payne she has noted a couple mild episodes of chest discomfort.    No HF symptoms  lipitor- metal taste  She used to walk.  Dog died.  95 mom sick   Past Medical History  Diagnosis Date  . Breast cancer ([place]) 1994    left breast, lumpectomy/negative nodes/tamoxifen x 5 years  . Hyperlipidemia   . Vulvar abscess 10/97  . Endometrial polyp 3/02  . Kidney cysts 2003    benign, fatty cyst/angiomyolipoma  . Osteopenia   . OAB (overactive bladder)   . Insomnia   . Hypertension     no meds., pt denies  . Hx of adenomatous colonic polyps 05/12/2014  . Palpitations 07/01/2015    Past Surgical History  Procedure Laterality Date  . Breast lumpectomy  1994    left side,radiation also  . Breast biopsy      right breast-benign growth  . Tonsillectomy and adenoidectomy    . Hysteroscopic resection  4/02     Current Outpatient Prescriptions  Medication Sig Dispense Refill  . Ascorbic Acid (VITAMIN C PO) Take 1 tablet by mouth daily. As needed    . aspirin 81 MG tablet Take 81 mg by mouth daily.    Marland Kitchen aspirin-acetaminophen-caffeine ([place])  250-250-65 MG tablet Take 1 tablet by mouth every 6 (six) hours as needed for headache or migraine.    . Coenzyme Q10 (COQ10 PO) Take 1 capsule by mouth daily.     Marland Kitchen ibuprofen (ADVIL,MOTRIN) 200 MG tablet Take 200 mg by mouth every 6 (six) hours as needed.    . zolpidem (AMBIEN) 10 MG tablet Take 10 mg by mouth at bedtime as needed for sleep. Take one-half tablet at bedtime    . rosuvastatin (CRESTOR) 5 MG tablet Take 1 tablet (5 mg total) by mouth every Monday, Wednesday, and Friday at 6 PM. 12 tablet 6   No current facility-administered medications for this visit.    Allergies:   Codeine    Social History:  The patient  reports that she has quit smoking. She has never used smokeless tobacco. She reports that  she does not drink alcohol or use illicit drugs.   Family History:  The patient's family history includes Colon cancer in her paternal grandfather; Colon cancer (age of onset: 19) in her mother; Esophageal cancer in her father; Heart attack in her sister; Irritable bowel syndrome in her maternal grandmother; Ovarian cancer in her sister.    ROS:  Please see the history of present illness.   Otherwise, review of systems are positive for none.   All other systems are reviewed and negative.    PHYSICAL EXAM: VS:  BP 108/76 mmHg  Pulse 69  Ht 5\' 6"  (1.676 m)  Wt 72.576 kg (160 lb)  BMI 25.84 kg/m2  LMP 06/19/1992 , BMI Body mass index is 25.84 kg/(m^2). GENERAL:  Well appearing HEENT:  Pupils equal round and reactive, fundi not visualized, oral mucosa unremarkable NECK:  No jugular venous distention, waveform within normal limits, carotid upstroke brisk and symmetric, no bruits, no thyromegaly LYMPHATICS:  No cervical adenopathy LUNGS:  Clear to auscultation bilaterally HEART:  RRR.  PMI not displaced or sustained,S1 and S2 within normal limits, no S3, no S4, no clicks, no rubs, no murmurs ABD:  Flat, positive bowel sounds normal in frequency in pitch, no bruits, no rebound, no guarding, no midline pulsatile mass, no hepatomegaly, no splenomegaly EXT:  2 plus pulses throughout, no edema, no cyanosis no clubbing SKIN:  No rashes no nodules NEURO:  Cranial nerves II through XII grossly intact, motor grossly intact throughout PSYCH:  Cognitively intact, oriented to person place and time    EKG:  EKG is ordered today. The ekg ordered today demonstrates Sinus rhythm. Rate 69 bpm.   Recent Labs: No results found for requested labs within last 365 days.   01/07/15: Chold 257, triglycerides 119, L 64, LDL 169  Lipid Panel    Component Value Date/Time   CHOL 219* 01/30/2012 1043   TRIG 70.0 01/30/2012 1043   HDL 79.10 01/30/2012 1043   CHOLHDL 3 01/30/2012 1043   VLDL 14.0 01/30/2012  1043   LDLDIRECT 128.3 01/30/2012 1043      Wt Readings from Last 3 Encounters:  07/01/15 72.576 kg (160 lb)  01/05/15 71.215 kg (157 lb)  08/05/14 71.668 kg (158 lb)      ASSESSMENT AND PLAN:  # Chest pain:  Jasmine Payne symptoms are atypcial, but concerning for ischemia.  He does have cardiac risk factors including age and untreated hyperlipidemia.  We will refer her for exercise Cardiolite to evaluate for ischemia.  # Palpitations: Her symptoms have significantly improved with the reduction of caffeine.  It is likely that caffeine and stress are triggers for  her palpitations. Given that they're occurring infrequently, she does not think that we are likely to find anything on a monitor at this time. We will check a comprehensive metabolic panel, magnesium,TSH, free T4,and CBC.If she begins having palpitations more frequently we will consider ambulatory monitoring.  We will also be able to assess for arrhythmia when she exercises for the stress test as above.  # Hyperlipidemia: ASCVD 10 year risk is 10.6%.  She is willing to try a different statin.  We will start  rosuvastatin 5 mg every Monday Wednesday and Friday.  Check lfts today.  Check lipids and LFTs in 3 months.  # CV Disease Prevention:  . We discussed the importance of increasing her exercise.  After stress testing we would like for her to start exercising at least 30-40 minutes most days of the week.  Continue aspirin and start rosuvastatin as above.  Current medicines are reviewed at length with the patient today.  The patient does not have concerns regarding medicines.  The following changes have been made:  Start rosuvastatin 5 mg every Monday Wednesday and Friday.  Labs/ tests ordered today include:   Orders Placed This Encounter  Procedures  . T4, free  . TSH  . Comprehensive metabolic panel  . Magnesium  . Myocardial Perfusion Imaging  . EKG 12-Lead     Disposition:   FU with Katena Petitjean C. Oval Linsey, MD, [facility] in 3  months.      This note was written with the assistance of speech recognition software.  Please excuse any transcriptional errors.  Signed, Joy Haegele C. Oval Linsey, MD, [facility]  07/01/2015 12:39 PM    [place]

## 2015-07-01 NOTE — Patient Instructions (Signed)
START CRESTOR 5 MG - MON - WED - FRI - EVENING MEALS  LABS -- CMP ,TSH, FREE T4, MAGNESIUM  Your physician has requested that you have en exercise stress myoview. For further information please visit [email or web address]. Please follow instruction sheet, as given. WILL CALL YOU WITH RESULTS   Your physician recommends that you schedule a follow-up appointment in [place]. If you need a refill on your cardiac medications before your next appointment, please call your pharmacy.

## 2015-07-05 ENCOUNTER — Telehealth: Payer: Self-pay | Admitting: Emergency Medicine

## 2015-07-05 NOTE — Telephone Encounter (Signed)
Out of hold per Dr. Miller.   

## 2015-07-05 NOTE — Telephone Encounter (Signed)
-----   Message from Megan Salon, MD sent at 07/04/2015  3:27 PM EST ----- Regarding: RE: Mammogram hold  Yes out of mmg hold.  MSM ----- Message -----    From: Michele Mcalpine, RN    Sent: 07/02/2015   4:45 PM      To: Megan Salon, MD Subject: Mammogram hold                                 Dr. Sabra Heck, patient in mammogram hold. Completed recall imaging, however, results were sent to PCP. Okay to remove from hold?

## 2015-07-07 LAB — COMPREHENSIVE METABOLIC PANEL
ALT: 21 U/L (ref 6–29)
AST: 18 U/L (ref 10–35)
Albumin: 4.2 g/dL (ref 3.6–5.1)
Alkaline Phosphatase: 38 U/L (ref 33–130)
BUN: 11 mg/dL (ref 7–25)
CALCIUM: 9.6 mg/dL (ref 8.6–10.4)
CHLORIDE: 103 mmol/L (ref 98–110)
CO2: 25 mmol/L (ref 20–31)
Creat: 0.81 mg/dL (ref 0.60–0.93)
GLUCOSE: 104 mg/dL — AB (ref 65–99)
POTASSIUM: 4.8 mmol/L (ref 3.5–5.3)
Sodium: 137 mmol/L (ref 135–146)
Total Bilirubin: 1.5 mg/dL — ABNORMAL HIGH (ref 0.2–1.2)
Total Protein: 6.2 g/dL (ref 6.1–8.1)

## 2015-07-07 LAB — MAGNESIUM: Magnesium: 2 mg/dL (ref 1.5–2.5)

## 2015-07-07 LAB — T4, FREE: FREE T4: 1.07 ng/dL (ref 0.80–1.80)

## 2015-07-07 LAB — TSH: TSH: 1.959 u[IU]/mL (ref 0.350–4.500)

## 2015-07-08 ENCOUNTER — Telehealth (HOSPITAL_COMMUNITY): Payer: Self-pay

## 2015-07-08 NOTE — Telephone Encounter (Signed)
Encounter complete. 

## 2015-07-09 ENCOUNTER — Telehealth: Payer: Self-pay | Admitting: *Deleted

## 2015-07-09 ENCOUNTER — Telehealth (HOSPITAL_COMMUNITY): Payer: Self-pay

## 2015-07-09 NOTE — Telephone Encounter (Signed)
Returning your call. °

## 2015-07-09 NOTE — Telephone Encounter (Signed)
Spoke to patient. Result given . Verbalized understanding  

## 2015-07-09 NOTE — Telephone Encounter (Signed)
Encounter complete. 

## 2015-07-09 NOTE — Telephone Encounter (Signed)
Left message to call back  

## 2015-07-09 NOTE — Telephone Encounter (Signed)
-----   Message from Skeet Latch, MD sent at 07/08/2015 11:09 AM EST ----- Normal thyroid function, kidney and liver function

## 2015-07-13 ENCOUNTER — Ambulatory Visit (HOSPITAL_COMMUNITY)
Admission: RE | Admit: 2015-07-13 | Discharge: 2015-07-13 | Disposition: A | Discharge status: Home / Self Care | Payer: Medicare Other | Source: Ambulatory Visit | Attending: Cardiology | Admitting: Cardiology

## 2015-07-13 DIAGNOSIS — I10 Essential (primary) hypertension: Secondary | ICD-10-CM | POA: Diagnosis not present

## 2015-07-13 DIAGNOSIS — R072 Precordial pain: Secondary | ICD-10-CM

## 2015-07-13 DIAGNOSIS — Z87891 Personal history of nicotine dependence: Secondary | ICD-10-CM | POA: Insufficient documentation

## 2015-07-13 DIAGNOSIS — E785 Hyperlipidemia, unspecified: Secondary | ICD-10-CM

## 2015-07-13 DIAGNOSIS — R55 Syncope and collapse: Secondary | ICD-10-CM | POA: Diagnosis not present

## 2015-07-13 DIAGNOSIS — R079 Chest pain, unspecified: Secondary | ICD-10-CM | POA: Diagnosis not present

## 2015-07-13 DIAGNOSIS — Z8249 Family history of ischemic heart disease and other diseases of the circulatory system: Secondary | ICD-10-CM | POA: Insufficient documentation

## 2015-07-13 DIAGNOSIS — R002 Palpitations: Secondary | ICD-10-CM

## 2015-07-13 MED ORDER — TECHNETIUM TC 99M SESTAMIBI GENERIC - CARDIOLITE
9.5000 | Freq: Once | INTRAVENOUS | Status: AC | PRN
  Administered 2015-07-13: 9.5 via INTRAVENOUS

## 2015-07-13 MED ORDER — TECHNETIUM TC 99M SESTAMIBI GENERIC - CARDIOLITE
30.1000 | Freq: Once | INTRAVENOUS | Status: AC | PRN
  Administered 2015-07-13: 30.1 via INTRAVENOUS

## 2015-07-14 ENCOUNTER — Telehealth: Payer: Self-pay | Admitting: *Deleted

## 2015-07-14 NOTE — Telephone Encounter (Signed)
Informed patient she has refilled on rosuvastatin for refilled x 6. Patient verbalized understanding and will call in the refill. Any problems will call back

## 2015-07-14 NOTE — Telephone Encounter (Signed)
-----   Message from Skeet Latch, MD sent at 07/13/2015  4:04 PM EST ----- Regarding: RE: medication Thank you,  Ivin Booty, please send rosuvastatin 5 mg qMWF to the pharmacy requested below.  Thanks, TCR  ----- Message -----    From: Corinna Lines    Sent: 07/13/2015   3:23 PM      To: Skeet Latch, MD Subject: medication                                       Dr. Oval Linsey  This patient states she is tolerating the statin therapy medication that you put her on to try for 1 month.  She would like [facility] [address] [facility] [place].  Thanks [facility]

## 2015-07-15 LAB — MYOCARDIAL PERFUSION IMAGING
Estimated workload: 7 METS
Exercise duration (min): 7 min
Exercise duration (sec): 0 s
LV dias vol: 71 mL
LV sys vol: 24 mL
MPHR: 146 {beats}/min
Peak HR: 127 {beats}/min
Percent HR: 86 %
RPE: 17
Rest HR: 56 {beats}/min
SDS: 1
SRS: 0
SSS: 1
TID: 1.22

## 2015-07-16 ENCOUNTER — Telehealth: Payer: Self-pay | Admitting: *Deleted

## 2015-07-16 NOTE — Telephone Encounter (Signed)
Spoke to patient. Result given . Verbalized understanding  

## 2015-07-16 NOTE — Telephone Encounter (Signed)
-----   Message from Skeet Latch, MD sent at 07/15/2015  5:11 PM EST ----- Normal stress test.

## 2015-07-24 ENCOUNTER — Other Ambulatory Visit: Payer: Self-pay | Admitting: Obstetrics & Gynecology

## 2015-07-26 NOTE — Telephone Encounter (Signed)
Medication refill request: Ambien  Last AEX:  01/05/15 SM Next AEX: 04/06/16 SM Last MMG (if hormonal medication request): 06/09/15 Diag Right BIRADS1:neg Refill authorized: please advise

## 2015-07-27 NOTE — Telephone Encounter (Signed)
Rx faxed today 

## 2015-08-04 ENCOUNTER — Telehealth: Payer: Self-pay | Admitting: Obstetrics & Gynecology

## 2015-08-04 NOTE — Telephone Encounter (Signed)
Patient is calling regarding her zolpidem (AMBIEN) 10 MG tablet . Patient was told to have our office cal [place] for refill  prior authorization. Patient is going out of town on Friday and needs this refill ASAP. Confirmed pharmacy with patient.

## 2015-08-06 ENCOUNTER — Telehealth: Payer: Self-pay | Admitting: Obstetrics & Gynecology

## 2015-08-06 NOTE — Telephone Encounter (Signed)
Pt has h/o hematuria with negative w/u.  Please follow up with her next week about symptoms.

## 2015-08-06 NOTE — Telephone Encounter (Signed)
PA for patient's Ambien 10 mg tablets was sent to [facility] on 07/29/2015 via cover my meds. Awaiting PA approval or denial.

## 2015-08-06 NOTE — Telephone Encounter (Signed)
Routing to [place] as Juluis Rainier. Anything further needed for this patient?

## 2015-08-06 NOTE — Telephone Encounter (Signed)
Patient called and requested to "come by and leave a urine sample for the nurse" because she is "leaving out of town." I offered her her an appointment this morning or afternoon with one of our providers. She declined, then she also said, "I don't have time to see one of your provider's today. I have to take my mom to the doctor and can only come by and leave a sample." I checked with Verline Lema, our triage nurse, who confirmed the patient needed to be on a provider's schedule. Patient stated she will have to just call back next week. Note to triage for FYI.

## 2015-08-09 NOTE — Telephone Encounter (Signed)
Spoke with patient. Patient states that she was seen with Dr.Russo on Friday and started on Cipro for UTI. Advised I am glad she is doing well and if she needs anything to give our office a call. She is agreeable.  Routing to provider for final review. Patient agreeable to disposition. Will close encounter.

## 2015-11-30 ENCOUNTER — Ambulatory Visit (INDEPENDENT_AMBULATORY_CARE_PROVIDER_SITE_OTHER): Payer: Medicare Other | Admitting: Cardiovascular Disease

## 2015-11-30 ENCOUNTER — Encounter: Payer: Self-pay | Admitting: Cardiovascular Disease

## 2015-11-30 VITALS — BP 116/84 | HR 65 | Ht 66.0 in | Wt 163.0 lb

## 2015-11-30 DIAGNOSIS — R002 Palpitations: Secondary | ICD-10-CM | POA: Diagnosis not present

## 2015-11-30 DIAGNOSIS — E785 Hyperlipidemia, unspecified: Secondary | ICD-10-CM

## 2015-11-30 NOTE — Progress Notes (Signed)
Cardiology Office Note   Date:  11/30/2015   ID:  BINAH Jasmine Payne, DOB 12-Feb-1941, MRN [id number]  PCP:  [name], MD  Cardiologist:   Skeet Latch, MD   Chief Complaint  Patient presents with  . Follow-up    3 months  pt states no Sx.      History of Present Illness: Jasmine Payne is a 75 y.o. female with breast cancer s/p lumpectomy and radiation and hyperlipidemia who presents for follow up.  She was initially evaluated for chest pain on 07/01/15.  She was referred for exercise Cardiolite that was negative for ischemia.  She exercised for 7 minutes on the Bruce Protocol (7 METS).  She also reported palpitations that improved after eliminating coffee. She was previously reluctant to try statins but was willing to start rosuvastatin 5 mg three times per week.  She has been tolerating this without any symptoms.  She thinks that her previous symptoms of chest pain and dizziness were related to stress from taking care of her 46 year old mother.  She denies any recent chest pain, shortness of breath, lightheadedness, or dizziness she spent a lot of time at the beach recently and was exercising regularly. She has been back for 3 days and has not exercised at the plans to increase her exercise most days of the week. She denies any exertional symptoms.   Past Medical History  Diagnosis Date  . Breast cancer ([place]) 1994    left breast, lumpectomy/negative nodes/tamoxifen x 5 years  . Hyperlipidemia   . Vulvar abscess 10/97  . Endometrial polyp 3/02  . Kidney cysts 2003    benign, fatty cyst/angiomyolipoma  . Osteopenia   . OAB (overactive bladder)   . Insomnia   . Hypertension     no meds., pt denies  . Hx of adenomatous colonic polyps 05/12/2014  . Palpitations 07/01/2015    Past Surgical History  Procedure Laterality Date  . Breast lumpectomy  1994    left side,radiation also  . Breast biopsy      right breast-benign growth  . Tonsillectomy and adenoidectomy    .  Hysteroscopic resection  4/02     Current Outpatient Prescriptions  Medication Sig Dispense Refill  . Ascorbic Acid (VITAMIN C PO) Take 1 tablet by mouth daily. As needed    . aspirin 81 MG tablet Take 81 mg by mouth daily.    Marland Kitchen aspirin-acetaminophen-caffeine (EXCEDRIN MIGRAINE) 250-250-65 MG tablet Take 1 tablet by mouth every 6 (six) hours as needed for headache or migraine.    Marland Kitchen ibuprofen (ADVIL,MOTRIN) 200 MG tablet Take 200 mg by mouth every 6 (six) hours as needed.    . rosuvastatin (CRESTOR) 5 MG tablet Take 1 tablet (5 mg total) by mouth every Monday, Wednesday, and Friday at 6 PM. 12 tablet 6  . zolpidem (AMBIEN) 10 MG tablet TAKE 1/2 TABLET AT BEDTIME 30 tablet 0   No current facility-administered medications for this visit.    Allergies:   Codeine    Social History:  The patient  reports that she has quit smoking. She has never used smokeless tobacco. She reports that she does not drink alcohol or use illicit drugs.   Family History:  The patient's family history includes Colon cancer in her paternal grandfather; Colon cancer (age of onset: 21) in her mother; Esophageal cancer in her father; Heart attack in her sister; Irritable bowel syndrome in her maternal grandmother; Ovarian cancer in her sister.  ROS:  Please see the history of present illness.   Otherwise, review of systems are positive for none.   All other systems are reviewed and negative.    PHYSICAL EXAM: VS:  BP 116/84 mmHg  Pulse 65  Ht 5\' 6"  (1.676 m)  Wt 163 lb (73.936 kg)  BMI 26.32 kg/m2  LMP 06/19/1992 , BMI Body mass index is 26.32 kg/(m^2). GENERAL:  Well appearing HEENT:  Pupils equal round and reactive, fundi not visualized, oral mucosa unremarkable NECK:  No jugular venous distention, waveform within normal limits, carotid upstroke brisk and symmetric, no bruits LYMPHATICS:  No cervical adenopathy LUNGS:  Clear to auscultation bilaterally HEART:  RRR.  PMI not displaced or sustained,S1 and  S2 within normal limits, no S3, no S4, no clicks, no rubs, no murmurs ABD:  Flat, positive bowel sounds normal in frequency in pitch, no bruits, no rebound, no guarding, no midline pulsatile mass, no hepatomegaly, no splenomegaly EXT:  2 plus pulses throughout, no edema, no cyanosis no clubbing SKIN:  No rashes no nodules NEURO:  Cranial nerves II through XII grossly intact, motor grossly intact throughout PSYCH:  Cognitively intact, oriented to person place and time  EKG:  EKG is ordered today. The ekg ordered today demonstrates Sinus rhythm. Rate 65 bpm.   Exercise Cardiolite 07/13/15:  Nuclear stress EF: 66%.  The left ventricular ejection fraction is hyperdynamic (>65%).  There was no ST segment deviation noted during stress.  The study is normal.  This is a low risk study.  Normal exercise nuclear study demonstrating normal myocardial perfusion and function; EF 66%.   Recent Labs: 07/06/2015: ALT 21; BUN 11; Creat 0.81; Magnesium 2.0; Potassium 4.8; Sodium 137; TSH 1.959   01/07/15: Chold 257, triglycerides 119, L 64, LDL 169  Lipid Panel    Component Value Date/Time   CHOL 219* 01/30/2012 1043   TRIG 70.0 01/30/2012 1043   HDL 79.10 01/30/2012 1043   CHOLHDL 3 01/30/2012 1043   VLDL 14.0 01/30/2012 1043   LDLDIRECT 128.3 01/30/2012 1043      Wt Readings from Last 3 Encounters:  11/30/15 163 lb (73.936 kg)  07/13/15 160 lb (72.576 kg)  07/01/15 160 lb (72.576 kg)      ASSESSMENT AND PLAN:  # Chest pain:  Ms. Jasmine Payne symptoms Have resolved.  Exercise Cardiolite was negative for ischemia.  # Palpitations: Symptoms have resolved number likely due to stress.  # Hyperlipidemia: ASCVD 10 year risk is 10.6%.  She has done well on rosuvastatin 5 mg every Monday Wednesday and Friday.  She is scheduled to see her PCP next month and prefers to have her lipids and LFTs checked at that time.  # CV Disease Prevention:  . We discussed the importance of increasing her  exercise.  After stress testing we would like for her to start exercising at least 30-40 minutes most days of the week.  Continue aspirin and start rosuvastatin as above.  Current medicines are reviewed at length with the patient today.  The patient does not have concerns regarding medicines.  The following changes have been made:  Start rosuvastatin 5 mg every Monday Wednesday and Friday.  Labs/ tests ordered today include:   Orders Placed This Encounter  Procedures  . EKG 12-Lead    Time spent: 15 minutes-Greater than 50% of this time was spent in counseling, explanation of diagnosis, planning of further management, and coordination of care.   Disposition:   FU with Roya Gieselman C. Oval Linsey, MD, [facility] as  needed.      This note was written with the assistance of speech recognition software.  Please excuse any transcriptional errors.  Signed, Tedric Leeth C. Oval Linsey, MD, [facility]  11/30/2015 12:26 PM    [place]

## 2015-11-30 NOTE — Patient Instructions (Signed)
Your physician recommends that you schedule a follow-up appointment in: as needed  

## 2015-12-01 ENCOUNTER — Other Ambulatory Visit: Payer: Self-pay | Admitting: Obstetrics & Gynecology

## 2015-12-01 NOTE — Telephone Encounter (Signed)
Medication refill request: Ambien Last AEX:  01-05-15 Next AEX: 01-11-16 Last MMG (if hormonal medication request): 06-09-15 WNL  Refill authorized: please advise

## 2015-12-02 NOTE — Telephone Encounter (Signed)
Rx faxed to pharmacy. -[place]

## 2016-01-04 ENCOUNTER — Telehealth: Payer: Self-pay | Admitting: Cardiovascular Disease

## 2016-01-04 MED ORDER — ROSUVASTATIN CALCIUM 5 MG PO TABS: ORAL_TABLET | ORAL | Status: DC

## 2016-01-04 NOTE — Telephone Encounter (Signed)
New Message  Pt c/o medication issue:  1. Name of Medication: Rosuvastatin    2. How are you currently taking this medication (dosage and times per day)? 5mg    3. Are you having a reaction (difficulty breathing--STAT)? No  4. What is your medication issue? Pt call to speak with RN about increasing the quantity. Please call back to discuss

## 2016-01-07 NOTE — Telephone Encounter (Signed)
rx sent to pharmacy as requested

## 2016-01-11 ENCOUNTER — Ambulatory Visit (INDEPENDENT_AMBULATORY_CARE_PROVIDER_SITE_OTHER): Payer: Medicare Other | Admitting: Obstetrics & Gynecology

## 2016-01-11 ENCOUNTER — Encounter: Payer: Self-pay | Admitting: Obstetrics & Gynecology

## 2016-01-11 VITALS — BP 110/80 | HR 64 | Resp 14 | Ht 65.25 in | Wt 159.0 lb

## 2016-01-11 DIAGNOSIS — Z01419 Encounter for gynecological examination (general) (routine) without abnormal findings: Secondary | ICD-10-CM | POA: Diagnosis not present

## 2016-01-11 DIAGNOSIS — Z124 Encounter for screening for malignant neoplasm of cervix: Secondary | ICD-10-CM

## 2016-01-11 NOTE — Progress Notes (Signed)
75 y.o. G2P2 DivorcedCaucasianF here for annual exam.  Doing well.  Denies vaginal bleeding.  Cardiologist is Dr. Oval Linsey.  Had myocardial perfusion test in January.  This was fine but done due to pt having cholesterol issues.  Pt is working on some weight loss.  Is taking a statin three times weekly.  Having blood work this week.  Seeing Dr. Virgina Jock next week.    Patient's last menstrual period was 06/19/1992.          Sexually active: No.  The current method of family planning is post menopausal status.    Exercising: Yes.    Walking Smoker:  no  Health Maintenance: Pap:  12/18/13 Neg History of abnormal Pap:  no MMG:  06/09/15 BIRADS1:Neg  Colonoscopy:  05/07/14 Polyps - f/u  5 years  BMD:   10/15/12 Osteopenia  TDaP:  2016 at pharmacy.  Pneumonia vaccine(s):  Done ~ 2015 Zostavax:   No Hep C testing: U not indicated Screening Labs: PCP, Urine today: PCP   reports that she has quit smoking. She has never used smokeless tobacco. She reports that she does not drink alcohol or use drugs.  Past Medical History:  Diagnosis Date  . Breast cancer ([place]) 1994   left breast, lumpectomy/negative nodes/tamoxifen x 5 years  . Endometrial polyp 3/02  . Hx of adenomatous colonic polyps 05/12/2014  . Hyperlipidemia   . Hypertension    no meds., pt denies  . Insomnia   . Kidney cysts 2003   benign, fatty cyst/angiomyolipoma  . OAB (overactive bladder)   . Osteopenia   . Palpitations 07/01/2015  . Vulvar abscess 10/97    Past Surgical History:  Procedure Laterality Date  . BREAST BIOPSY     right breast-benign growth  . BREAST LUMPECTOMY  1994   left side,radiation also  . hysteroscopic resection  4/02  . TONSILLECTOMY AND ADENOIDECTOMY      Current Outpatient Prescriptions  Medication Sig Dispense Refill  . Ascorbic Acid (VITAMIN C PO) Take 1 tablet by mouth daily. As needed    . aspirin 81 MG tablet Take 81 mg by mouth daily.    Marland Kitchen aspirin-acetaminophen-caffeine (EXCEDRIN MIGRAINE)  250-250-65 MG tablet Take 1 tablet by mouth every 6 (six) hours as needed for headache or migraine.    Marland Kitchen ibuprofen (ADVIL,MOTRIN) 200 MG tablet Take 200 mg by mouth every 6 (six) hours as needed.    . rosuvastatin (CRESTOR) 5 MG tablet 1 tablet by mouth on Monday, Wednesday, and Friday or as directed by physician 90 tablet 1  . zolpidem (AMBIEN) 10 MG tablet TAKE 1/2 TABLET AT BEDTIME 30 tablet 0   No current facility-administered medications for this visit.     Family History  Problem Relation Age of Onset  . Esophageal cancer Father   . Colon cancer Mother 39  . Ovarian cancer Sister   . Heart attack Sister   . Colon cancer Paternal Grandfather   . Irritable bowel syndrome Maternal Grandmother     ROS:  Pertinent items are noted in HPI.  Otherwise, a comprehensive ROS was negative.  Exam:   BP 110/80 (BP Location: Right Arm, Patient Position: Sitting, Cuff Size: Normal)   Pulse 64   Resp 14   Ht 5' 5.25" (1.657 m)   Wt 159 lb (72.1 kg)   LMP 06/19/1992   BMI 26.26 kg/m     Height: 5' 5.25" (165.7 cm)  Ht Readings from Last 3 Encounters:  01/11/16 5' 5.25" (1.657 m)  11/30/15 5\' 6"  (1.676 m)  07/13/15 5\' 6"  (1.676 m)    General appearance: alert, cooperative and appears stated age Head: Normocephalic, without obvious abnormality, atraumatic Neck: no adenopathy, supple, symmetrical, trachea midline and thyroid normal to inspection and palpation Lungs: clear to auscultation bilaterally Breasts: no breast masses, LAD, or skin changes.  well healed left axillary scar.  Minimal radiation changes. Heart: regular rate and rhythm Abdomen: soft, non-tender; bowel sounds normal; no masses,  no organomegaly Extremities: extremities normal, atraumatic, no cyanosis or edema Skin: Skin color, texture, turgor normal. No rashes or lesions Lymph nodes: Cervical, supraclavicular, and axillary nodes normal. No abnormal inguinal nodes palpated Neurologic: Grossly normal   Pelvic:  External genitalia:  no lesions              Urethra:  normal appearing urethra with no masses, tenderness or lesions              Bartholins and Skenes: normal                 Vagina: normal appearing vagina with normal color and discharge, no lesions              Cervix: no lesions              Pap taken: Yes.   Bimanual Exam:  Uterus:  normal size, contour, position, consistency, mobility, non-tender              Adnexa: normal adnexa and no mass, fullness, tenderness               Rectovaginal: Confirms               Anus:  normal sphincter tone, no lesions  Chaperone was present for exam.  A:  Well Woman with normal exam H/O Breast cancer 1994--Lobar cancer Osteopenia OAB Insomnia  P: Mammogram yearly. 3D recommended. pap smear 2015.  No pap obtained today. Using Aspen 5mg  one or two times weekly.  No rx needed.  D/W pt no long term studies regarding long term safety or risks to cognitive function.  Pt willing to accept risks due to struggles with insomnia.  No RF needed at this time. Labs with Dr. Virgina Jock on Thursday and has appt next week return annually or prn

## 2016-01-12 LAB — IPS PAP SMEAR ONLY

## 2016-04-06 ENCOUNTER — Ambulatory Visit: Payer: Medicare Other | Admitting: Obstetrics & Gynecology

## 2016-05-04 ENCOUNTER — Other Ambulatory Visit: Payer: Self-pay | Admitting: Obstetrics and Gynecology

## 2016-05-05 NOTE — Telephone Encounter (Signed)
Medication refill request: Ambien 10mg  Last AEX:  01/11/16 SM Next AEX: 04/06/17 Last MMG (if hormonal medication request): 06/09/15 BIRADS1:Neg  Refill authorized: 12/02/15 #30 w/0 refills; today please advise, thanks

## 2016-05-05 NOTE — Telephone Encounter (Signed)
Prescription faxed to pharmacy.

## 2016-05-08 ENCOUNTER — Other Ambulatory Visit: Payer: Self-pay | Admitting: Internal Medicine

## 2016-05-08 DIAGNOSIS — Z1231 Encounter for screening mammogram for malignant neoplasm of breast: Secondary | ICD-10-CM

## 2016-06-16 ENCOUNTER — Ambulatory Visit: Payer: Medicare Other

## 2016-06-19 HISTORY — PX: CATARACT EXTRACTION, BILATERAL: SHX1313

## 2016-06-21 ENCOUNTER — Ambulatory Visit: Payer: Medicare Other

## 2016-06-23 ENCOUNTER — Ambulatory Visit
Admission: RE | Admit: 2016-06-23 | Discharge: 2016-06-23 | Disposition: A | Discharge status: Home / Self Care | Payer: Medicare Other | Source: Ambulatory Visit | Attending: Internal Medicine | Admitting: Internal Medicine

## 2016-06-23 DIAGNOSIS — Z1231 Encounter for screening mammogram for malignant neoplasm of breast: Secondary | ICD-10-CM

## 2016-12-22 ENCOUNTER — Other Ambulatory Visit: Payer: Self-pay | Admitting: Obstetrics & Gynecology

## 2016-12-22 NOTE — Telephone Encounter (Signed)
Medication refill request: ambien  Last AEX:  01/11/16 SM  Next AEX: 04/06/17 SM  Last MMG (if hormonal medication request): 06/23/16 BIRADS1:neg  Refill authorized: 05/05/16 #30/0R. Today please advise.

## 2017-01-23 ENCOUNTER — Other Ambulatory Visit: Payer: Self-pay | Admitting: Cardiovascular Disease

## 2017-01-23 NOTE — Telephone Encounter (Signed)
Please review for refill, Thanks !  

## 2017-01-23 NOTE — Telephone Encounter (Signed)
REFILL 

## 2017-03-02 ENCOUNTER — Other Ambulatory Visit: Payer: Self-pay | Admitting: Cardiovascular Disease

## 2017-04-02 ENCOUNTER — Other Ambulatory Visit: Payer: Self-pay | Admitting: Obstetrics & Gynecology

## 2017-04-03 NOTE — Telephone Encounter (Signed)
Medication refill request: ambien  Last AEX:  01/11/16 SM Next AEX: 04/06/17 SM Last MMG (if hormonal medication request): 06/23/16 BIRADS1:neg  Refill authorized: 12/22/16 #30tabs/0R. Today please advise.

## 2017-04-04 NOTE — Telephone Encounter (Signed)
Rx faxed today 

## 2017-04-06 ENCOUNTER — Ambulatory Visit: Payer: Medicare Other | Admitting: Obstetrics & Gynecology

## 2017-05-08 ENCOUNTER — Telehealth: Payer: Self-pay | Admitting: *Deleted

## 2017-05-08 ENCOUNTER — Ambulatory Visit: Payer: Medicare Other | Admitting: Obstetrics & Gynecology

## 2017-05-08 ENCOUNTER — Telehealth: Payer: Self-pay | Admitting: Obstetrics & Gynecology

## 2017-05-08 NOTE — Progress Notes (Deleted)
76 y.o. G2P2 DivorcedCaucasianF here for annual exam.    Patient's last menstrual period was 06/19/1992.          Sexually active: {yes no:314532}  The current method of family planning is post menopausal status.    Exercising: {yes no:314532}  {types:19826} Smoker:  {YES P5382123  Health Maintenance: Pap:  01/11/16 Neg   12/18/13 neg  History of abnormal Pap:  no MMG:  06/23/16 BIRADS1:neg  Colonoscopy:  05/07/14 polyps. F/u 5 years BMD:   10/15/12 Osteopenia  TDaP:  2016  Pneumonia vaccine(s):  2015  Zostavax:   *** Hep C testing: *** Screening Labs: ***, Hb today: ***, Urine today: ***   reports that she has quit smoking. she has never used smokeless tobacco. She reports that she does not drink alcohol or use drugs.  Past Medical History:  Diagnosis Date  . Breast cancer ([place]) 1994   left breast, lumpectomy/negative nodes/tamoxifen x 5 years  . Endometrial polyp 3/02  . Hx of adenomatous colonic polyps 05/12/2014  . Hyperlipidemia   . Hypertension    no meds., pt denies  . Insomnia   . Kidney cysts 2003   benign, fatty cyst/angiomyolipoma  . OAB (overactive bladder)   . Osteopenia   . Palpitations 07/01/2015  . Vulvar abscess 10/97    Past Surgical History:  Procedure Laterality Date  . BREAST BIOPSY     right breast-benign growth  . BREAST LUMPECTOMY  1994   left side,radiation also  . hysteroscopic resection  4/02  . TONSILLECTOMY AND ADENOIDECTOMY      Current Outpatient Medications  Medication Sig Dispense Refill  . Ascorbic Acid (VITAMIN C PO) Take 1 tablet by mouth daily. As needed    . aspirin 81 MG tablet Take 81 mg by mouth daily.    Marland Kitchen aspirin-acetaminophen-caffeine (EXCEDRIN MIGRAINE) 250-250-65 MG tablet Take 1 tablet by mouth every 6 (six) hours as needed for headache or migraine.    Marland Kitchen ibuprofen (ADVIL,MOTRIN) 200 MG tablet Take 200 mg by mouth every 6 (six) hours as needed.    . rosuvastatin (CRESTOR) 5 MG tablet TAKE 1 TABLET BY MOUTH 3 TIMES A  WEEK. NEEDS OFFICE VISIT 12 tablet 0  . zolpidem (AMBIEN) 10 MG tablet TAKE ONE-HALF TABLETS AT BEDTIME AS NEEDED FOR SLEEP 30 tablet 0   No current facility-administered medications for this visit.     Family History  Problem Relation Age of Onset  . Esophageal cancer Father   . Colon cancer Mother 12  . Ovarian cancer Sister   . Heart attack Sister   . Colon cancer Paternal Grandfather   . Irritable bowel syndrome Maternal Grandmother     ROS:  Pertinent items are noted in HPI.  Otherwise, a comprehensive ROS was negative.  Exam:   LMP 06/19/1992   Weight change: @WEIGHTCHANGE @ Height:      Ht Readings from Last 3 Encounters:  01/11/16 5' 5.25" (1.657 m)  11/30/15 5\' 6"  (1.676 m)  07/13/15 5\' 6"  (1.676 m)    General appearance: alert, cooperative and appears stated age Head: Normocephalic, without obvious abnormality, atraumatic Neck: no adenopathy, supple, symmetrical, trachea midline and thyroid {EXAM; THYROID:18604} Lungs: clear to auscultation bilaterally Breasts: {Exam; breast:13139::"normal appearance, no masses or tenderness"} Heart: regular rate and rhythm Abdomen: soft, non-tender; bowel sounds normal; no masses,  no organomegaly Extremities: extremities normal, atraumatic, no cyanosis or edema Skin: Skin color, texture, turgor normal. No rashes or lesions Lymph nodes: Cervical, supraclavicular, and axillary nodes normal. No abnormal  inguinal nodes palpated Neurologic: Grossly normal   Pelvic: External genitalia:  no lesions              Urethra:  normal appearing urethra with no masses, tenderness or lesions              Bartholins and Skenes: normal                 Vagina: normal appearing vagina with normal color and discharge, no lesions              Cervix: {exam; cervix:14595}              Pap taken: {yes no:314532} Bimanual Exam:  Uterus:  {exam; uterus:12215}              Adnexa: {exam; adnexa:12223}               Rectovaginal: Confirms                Anus:  normal sphincter tone, no lesions  Chaperone was present for exam.  A:  Well Woman with normal exam  P:   {plan; gyn:5269::"mammogram","pap smear","return annually or prn"}

## 2017-05-08 NOTE — Telephone Encounter (Signed)
Spoke with patient. Patient states she has an area on right breast that was recently biopsied by Dr. Renda Rolls, awaiting results.   Describes as round, red, on "crater", scabbed and faded to a red spot. Sits on a previous scar.   Hx of left breast cancer. Denies pain, tenderness or nipple d/c.   Recommended OV for further evaluation. Patient states she is headed out of town, will see Dr. Sabra Heck on 12/18 when she returns. Will await biopsy results. Wants Dr. Sabra Heck to be aware.   Advised patient return call to office if earlier appointment needed. Will update Dr. Sabra Heck and return call with any recommendations. Patient is agreeable.   Routing to provider for final review. Patient is agreeable to disposition. Will close encounter.        Leeroy Bock routed this conversation to [facility] Triage Joelyn Oms    05/08/17 12:43 PM  Note  1. Called and spoke with patient to reschedule her AEX for today due to the doctor being delayed in surgery. She is now rescheduled to 06/05/17 as she will be out of town until then.  2. Patient stated she'd like a call back from the nurse. She has questions about a spot on one of her breasts she is concerned about.  Last seen: 01/11/16

## 2017-05-08 NOTE — Telephone Encounter (Signed)
1. Called and spoke with patient to reschedule her AEX for today due to the doctor being delayed in surgery. She is now rescheduled to 06/05/17 as she will be out of town until then.  2. Patient stated she'd like a call back from the nurse. She has questions about a spot on one of her breasts she is concerned about.  Last seen: 01/11/16

## 2017-05-08 NOTE — Telephone Encounter (Signed)
Encounter previously closed, will create new encounter. See telephone encounter dated 05/08/17.

## 2017-06-04 ENCOUNTER — Other Ambulatory Visit: Payer: Self-pay | Admitting: Obstetrics & Gynecology

## 2017-06-04 DIAGNOSIS — Z1231 Encounter for screening mammogram for malignant neoplasm of breast: Secondary | ICD-10-CM

## 2017-06-05 ENCOUNTER — Encounter: Payer: Self-pay | Admitting: Obstetrics & Gynecology

## 2017-06-05 ENCOUNTER — Ambulatory Visit (INDEPENDENT_AMBULATORY_CARE_PROVIDER_SITE_OTHER): Payer: Medicare Other | Admitting: Obstetrics & Gynecology

## 2017-06-05 VITALS — BP 110/60 | HR 74 | Resp 18 | Ht 65.5 in | Wt 170.0 lb

## 2017-06-05 DIAGNOSIS — Z01419 Encounter for gynecological examination (general) (routine) without abnormal findings: Secondary | ICD-10-CM | POA: Diagnosis not present

## 2017-06-05 MED ORDER — ZOLPIDEM TARTRATE 10 MG PO TABS: 5.0000 mg | ORAL_TABLET | Freq: Every evening | ORAL | 0 refills | Status: DC | PRN

## 2017-06-05 NOTE — Progress Notes (Signed)
76 y.o. G2P2 DivorcedCaucasianF here for annual exam.  Doing well.  Denies vaginal bleeding.    PCP:  Dr. Virgina Jock.  Did blood work earlier this year.    Patient's last menstrual period was 06/19/1992.          Sexually active: No.  The current method of family planning is post menopausal status.    Exercising: Yes.    Walking Smoker:  no  Health Maintenance: Pap:  01/11/16 neg   12/18/13 neg  History of abnormal Pap:  no MMG:  06/23/16 BIRADS1:neg. Appt 07/02/17 Colonoscopy:  05/07/14 polyp.  No f/u needed due to age.  BMD:   10/18 with Dr. Virgina Jock.  Stable with osteopenia. TDaP:  2016  Pneumonia vaccine(s):  2015 Zostavax:   No Hep C testing: N/A Screening Labs: PCP   reports that she has quit smoking. she has never used smokeless tobacco. She reports that she does not drink alcohol or use drugs.  Past Medical History:  Diagnosis Date  . Breast cancer ([place]) 1994   left breast, lumpectomy/negative nodes/tamoxifen x 5 years  . Endometrial polyp 3/02  . Hx of adenomatous colonic polyps 05/12/2014  . Hyperlipidemia   . Hypertension    no meds., pt denies  . Insomnia   . Kidney cysts 2003   benign, fatty cyst/angiomyolipoma  . OAB (overactive bladder)   . Osteopenia   . Palpitations 07/01/2015  . Vulvar abscess 10/97    Past Surgical History:  Procedure Laterality Date  . BREAST BIOPSY     right breast-benign growth  . BREAST LUMPECTOMY  1994   left side,radiation also  . CATARACT EXTRACTION, BILATERAL  2018  . hysteroscopic resection  4/02  . TONSILLECTOMY AND ADENOIDECTOMY      Current Outpatient Medications  Medication Sig Dispense Refill  . Ascorbic Acid (VITAMIN C PO) Take 1 tablet by mouth daily. As needed    . aspirin 81 MG tablet Take 81 mg by mouth daily.    Marland Kitchen aspirin-acetaminophen-caffeine (EXCEDRIN MIGRAINE) 250-250-65 MG tablet Take 1 tablet by mouth every 6 (six) hours as needed for headache or migraine.    . Coenzyme Q10 (CO Q 10) 100 MG CAPS Take by mouth.     Marland Kitchen ibuprofen (ADVIL,MOTRIN) 200 MG tablet Take 200 mg by mouth every 6 (six) hours as needed.    . Multiple Vitamins-Minerals (VISION-VITE PRESERVE PO) Take by mouth.    . rosuvastatin (CRESTOR) 5 MG tablet TAKE 1 TABLET BY MOUTH 3 TIMES A WEEK. NEEDS OFFICE VISIT 12 tablet 0  . zolpidem (AMBIEN) 10 MG tablet TAKE ONE-HALF TABLETS AT BEDTIME AS NEEDED FOR SLEEP 30 tablet 0   No current facility-administered medications for this visit.     Family History  Problem Relation Age of Onset  . Esophageal cancer Father   . Colon cancer Mother 55  . Ovarian cancer Sister   . Heart attack Sister   . Colon cancer Paternal Grandfather   . Irritable bowel syndrome Maternal Grandmother     ROS:  Pertinent items are noted in HPI.  Otherwise, a comprehensive ROS was negative.  Exam:   BP 110/60 (BP Location: Right Arm, Patient Position: Sitting, Cuff Size: Large)   Pulse 74   Resp 18   Ht 5' 5.5" (1.664 m)   Wt 170 lb (77.1 kg)   LMP 06/19/1992   BMI 27.86 kg/m   Weight change: +11#  Height: 5' 5.5" (166.4 cm)  Ht Readings from Last 3 Encounters:  06/05/17 5'  5.5" (1.664 m)  01/11/16 5' 5.25" (1.657 m)  11/30/15 5\' 6"  (1.676 m)    General appearance: alert, cooperative and appears stated age Head: Normocephalic, without obvious abnormality, atraumatic Neck: no adenopathy, supple, symmetrical, trachea midline and thyroid normal to inspection and palpation Lungs: clear to auscultation bilaterally Breasts: normal appearance, no masses or tenderness, well healed right axillary scar, minimal radiation changes on right Heart: regular rate and rhythm Abdomen: soft, non-tender; bowel sounds normal; no masses,  no organomegaly Extremities: extremities normal, atraumatic, no cyanosis or edema Skin: Skin color, texture, turgor normal. No rashes or lesions Lymph nodes: Cervical, supraclavicular, and axillary nodes normal. No abnormal inguinal nodes palpated Neurologic: Grossly normal   Pelvic:  External genitalia:  no lesions              Urethra:  normal appearing urethra with no masses, tenderness or lesions              Bartholins and Skenes: normal                 Vagina: normal appearing vagina with normal color and discharge, no lesions              Cervix: no lesions              Pap taken: No. Bimanual Exam:  Uterus:  normal size, contour, position, consistency, mobility, non-tender              Adnexa: normal adnexa and no mass, fullness, tenderness               Rectovaginal: Confirms               Anus:  normal sphincter tone, no lesions  Chaperone was present for exam.  A:  Well Woman with normal exam H/o breast cancer 1994, lobar Osteopenia H/o urinary urgency Chronic insomnia, uses Ambiem prn  P:   Mammogram guidelines reviewed. Pap smear 2017.  No pap smear needed today. Rx for Shingrix vaccine given today Rx for Ambien 5mg  qhs prn.  Uses rarely.  #30/0RF. return annually or prn

## 2017-07-02 ENCOUNTER — Ambulatory Visit
Admission: RE | Admit: 2017-07-02 | Discharge: 2017-07-02 | Disposition: A | Discharge status: Home / Self Care | Payer: Medicare Other | Source: Ambulatory Visit | Attending: Obstetrics & Gynecology | Admitting: Obstetrics & Gynecology

## 2017-07-02 DIAGNOSIS — Z1231 Encounter for screening mammogram for malignant neoplasm of breast: Secondary | ICD-10-CM

## 2017-07-02 HISTORY — DX: Personal history of irradiation: Z92.3

## 2017-12-13 ENCOUNTER — Telehealth: Payer: Self-pay | Admitting: Obstetrics & Gynecology

## 2017-12-13 NOTE — Telephone Encounter (Signed)
Patient left voicemail to speak with a nurse.

## 2017-12-14 ENCOUNTER — Encounter: Payer: Self-pay | Admitting: Obstetrics & Gynecology

## 2017-12-14 ENCOUNTER — Ambulatory Visit: Payer: Medicare Other | Admitting: Obstetrics & Gynecology

## 2017-12-14 VITALS — BP 104/64 | HR 68 | Temp 98.1°F | Resp 16 | Ht 65.5 in | Wt 171.0 lb

## 2017-12-14 DIAGNOSIS — N898 Other specified noninflammatory disorders of vagina: Secondary | ICD-10-CM | POA: Diagnosis not present

## 2017-12-14 DIAGNOSIS — D1771 Benign lipomatous neoplasm of kidney: Secondary | ICD-10-CM

## 2017-12-14 DIAGNOSIS — R300 Dysuria: Secondary | ICD-10-CM

## 2017-12-14 LAB — POCT URINALYSIS DIPSTICK
Bilirubin, UA: NEGATIVE
Glucose, UA: NEGATIVE
Ketones, UA: NEGATIVE
NITRITE UA: NEGATIVE
PH UA: 5 (ref 5.0–8.0)
PROTEIN UA: NEGATIVE
Urobilinogen, UA: 0.2 E.U./dL

## 2017-12-14 MED ORDER — CIPROFLOXACIN HCL 500 MG PO TABS: 500.0000 mg | ORAL_TABLET | Freq: Two times a day (BID) | ORAL | 0 refills | Status: DC

## 2017-12-14 MED ORDER — ZOLPIDEM TARTRATE 10 MG PO TABS: 5.0000 mg | ORAL_TABLET | Freq: Every evening | ORAL | 1 refills | Status: DC | PRN

## 2017-12-14 NOTE — Telephone Encounter (Signed)
Patient reports intermittent vaginal d/c and burning since returning from the beach. Describes as yellow, sticky and no odor. Spent [place] months [place] the beach in the spring, was treated for UTI x2 while there.   Denies odor, bleeding, pain or urinary complaints.   Recommended OV for further evaluation. OV scheduled for today at 3pm with Dr. Sabra Heck. Patient verbalizes understanding and is agreeable. Encounter closed.

## 2017-12-14 NOTE — Progress Notes (Signed)
GYNECOLOGY  VISIT  CC:   Vaginal discharge  HPI: 77 y.o. G2P2 Divorced Caucasian female here for vaginal discharge that has been intermittently present for the past two months.    Has experienced two UTIs earlier this year (in March and May).  [place] at her mother's home (that she recently inherited).  She did take antibiotics for both of these episodes and saw provider at an urgent care.  She doesn't typically have this issue so she's not sure if she's not drinking enough water or just being in a different place for a long period of time was the cause.  She is not sure if this is related to the discharge.    The discharge does not seem to be present all of the time.  She cannot determine what makes it intermittent.  The discharge does not have odor but is sticky.  She sees it in her underwear and with wiping at times.  She denies vaginal bleeding.    Pt will be going to [place] in a few weeks and will be there for three.  She is going with a friend whose boyfriend inherited a home there.  The friend goes for several months each year and invited Ms. Deschepper.  She needs an Ambien refill.  She uses this rarely and would like a Cipro rx incase has travelers diarrhea.  She will not have access to good medical care in [place] unless she travels several hours.    GYNECOLOGIC HISTORY: Patient's last menstrual period was 06/19/1992. Contraception: post menopausal  Menopausal hormone therapy: none  Patient Active Problem List   Diagnosis Date Noted  . Palpitations 07/01/2015  . Hx of adenomatous colonic polyps 05/12/2014  . Pure hypercholesterolemia 01/30/2012  . ADENOCARCINOMA, LEFT BREAST 03/01/2009    Past Medical History:  Diagnosis Date  . Breast cancer ([place]) 1994   left breast, lumpectomy/negative nodes/tamoxifen x 5 years  . Endometrial polyp 3/02  . Hx of adenomatous colonic polyps 05/12/2014  . Hyperlipidemia   . Hypertension    no meds., pt denies  . Insomnia    . Kidney cysts 2003   benign, fatty cyst/angiomyolipoma  . OAB (overactive bladder)   . Osteopenia   . Palpitations 07/01/2015  . Personal history of radiation therapy   . Vulvar abscess 10/97    Past Surgical History:  Procedure Laterality Date  . BREAST BIOPSY     right breast-benign growth  . BREAST LUMPECTOMY  1994   left side,radiation also  . CATARACT EXTRACTION, BILATERAL  2018  . hysteroscopic resection  4/02  . TONSILLECTOMY AND ADENOIDECTOMY      MEDS:   Current Outpatient Medications on File Prior to Visit  Medication Sig Dispense Refill  . Ascorbic Acid (VITAMIN C PO) Take 1 tablet by mouth daily. As needed    . aspirin-acetaminophen-caffeine (EXCEDRIN MIGRAINE) 250-250-65 MG tablet Take 1 tablet by mouth every 6 (six) hours as needed for headache or migraine.    . Coenzyme Q10 (CO Q 10) 100 MG CAPS Take by mouth.    . cycloSPORINE (RESTASIS) 0.05 % ophthalmic emulsion daily.    Marland Kitchen ibuprofen (ADVIL,MOTRIN) 200 MG tablet Take 200 mg by mouth every 6 (six) hours as needed.    . Multiple Vitamins-Minerals (VISION-VITE PRESERVE PO) Take by mouth.    . rosuvastatin (CRESTOR) 5 MG tablet TAKE 1 TABLET BY MOUTH 3 TIMES A WEEK. NEEDS OFFICE VISIT 12 tablet 0   No current facility-administered medications on  file prior to visit.     ALLERGIES: Codeine  Family History  Problem Relation Age of Onset  . Esophageal cancer Father   . Colon cancer Mother 69  . Ovarian cancer Sister   . Heart attack Sister   . Colon cancer Paternal Grandfather   . Irritable bowel syndrome Maternal Grandmother     SH:  Divorced, non smoker  Review of Systems  Constitutional: Negative.   Gastrointestinal: Negative.   Genitourinary: Negative for dysuria, frequency, hematuria and urgency.    PHYSICAL EXAMINATION:    BP 104/64   Pulse 68   Temp 98.1 F (36.7 C) (Oral)   Resp 16   Ht 5' 5.5" (1.664 m)   Wt 171 lb (77.6 kg)   LMP 06/19/1992   BMI 28.02 kg/m     General  appearance: alert, cooperative and appears stated age Abdomen: soft, non-tender; bowel sounds normal; no masses,  no organomegaly  Pelvic: External genitalia:  no lesions              Urethra:  normal appearing urethra with no masses, tenderness or lesions              Bartholins and Skenes: normal                 Vagina: normal appearing vagina with normal color and whitish, thick discharge without odor is present, no lesions              Cervix: no lesions              Bimanual Exam:  Uterus:  normal size, contour, position, consistency, mobility, non-tender              Adnexa: no mass, fullness, tenderness              Anus:  no lesions  Chaperone was present for exam.  Assessment: Whitish vaginal discharge H/O UTI x 2 in the last three months  Plan: Repeat urine micro and culture Affirm testing pending Rx for cipro 50mg  bid x 3 days to pharmacy with 1RF Ambien 5mg  nightly prn.  #30/1RF to pharmacy.

## 2017-12-15 LAB — VAGINITIS/VAGINOSIS, DNA PROBE
Candida Species: NEGATIVE
Gardnerella vaginalis: NEGATIVE
Trichomonas vaginosis: NEGATIVE

## 2017-12-15 LAB — URINALYSIS, MICROSCOPIC ONLY: Casts: NONE SEEN /lpf

## 2017-12-15 LAB — URINE CULTURE: ORGANISM ID, BACTERIA: NO GROWTH

## 2017-12-18 ENCOUNTER — Encounter: Payer: Self-pay | Admitting: Obstetrics & Gynecology

## 2017-12-18 NOTE — Addendum Note (Signed)
Addended by: Megan Salon on: 12/18/2017 05:20 PM   Modules accepted: Orders

## 2018-02-17 ENCOUNTER — Other Ambulatory Visit: Payer: Self-pay | Admitting: Obstetrics & Gynecology

## 2018-02-20 ENCOUNTER — Other Ambulatory Visit: Payer: Self-pay | Admitting: Obstetrics & Gynecology

## 2018-02-21 NOTE — Telephone Encounter (Signed)
Medication refill request: ambien 10 mg  Last AEX:  06/05/17 Next AEX: 07/18/18 Last MMG (if hormonal medication request): 07/02/17 Bi-rads category 1 neg  Refill authorized: Please advise

## 2018-05-24 ENCOUNTER — Other Ambulatory Visit: Payer: Self-pay | Admitting: Obstetrics & Gynecology

## 2018-05-24 DIAGNOSIS — Z1231 Encounter for screening mammogram for malignant neoplasm of breast: Secondary | ICD-10-CM

## 2018-07-03 ENCOUNTER — Ambulatory Visit
Admission: RE | Admit: 2018-07-03 | Discharge: 2018-07-03 | Disposition: A | Discharge status: Home / Self Care | Payer: Medicare Other | Source: Ambulatory Visit | Attending: Obstetrics & Gynecology | Admitting: Obstetrics & Gynecology

## 2018-07-03 DIAGNOSIS — Z1231 Encounter for screening mammogram for malignant neoplasm of breast: Secondary | ICD-10-CM

## 2018-07-18 ENCOUNTER — Ambulatory Visit (INDEPENDENT_AMBULATORY_CARE_PROVIDER_SITE_OTHER): Payer: Medicare Other | Admitting: Obstetrics & Gynecology

## 2018-07-18 ENCOUNTER — Encounter: Payer: Self-pay | Admitting: Obstetrics & Gynecology

## 2018-07-18 VITALS — BP 120/70 | HR 68 | Resp 18 | Ht 65.5 in | Wt 168.6 lb

## 2018-07-18 DIAGNOSIS — R82998 Other abnormal findings in urine: Secondary | ICD-10-CM

## 2018-07-18 DIAGNOSIS — Z01419 Encounter for gynecological examination (general) (routine) without abnormal findings: Secondary | ICD-10-CM

## 2018-07-18 DIAGNOSIS — M8580 Other specified disorders of bone density and structure, unspecified site: Secondary | ICD-10-CM

## 2018-07-18 DIAGNOSIS — Z0000 Encounter for general adult medical examination without abnormal findings: Secondary | ICD-10-CM | POA: Diagnosis not present

## 2018-07-18 DIAGNOSIS — R21 Rash and other nonspecific skin eruption: Secondary | ICD-10-CM

## 2018-07-18 LAB — POCT URINALYSIS DIPSTICK
Bilirubin, UA: NEGATIVE
Blood, UA: NEGATIVE
GLUCOSE UA: NEGATIVE
Ketones, UA: NEGATIVE
Nitrite, UA: NEGATIVE
Protein, UA: NEGATIVE
Urobilinogen, UA: 0.2 E.U./dL
pH, UA: 5 (ref 5.0–8.0)

## 2018-07-18 MED ORDER — ZOLPIDEM TARTRATE 10 MG PO TABS: ORAL_TABLET | ORAL | 1 refills | Status: DC

## 2018-07-18 MED ORDER — VALACYCLOVIR HCL 500 MG PO TABS: 500.0000 mg | ORAL_TABLET | Freq: Two times a day (BID) | ORAL | 0 refills | Status: DC

## 2018-07-18 MED ORDER — ACYCLOVIR 5 % EX CREA: 1.0000 "application " | TOPICAL_CREAM | CUTANEOUS | 1 refills | Status: DC

## 2018-07-18 NOTE — Progress Notes (Signed)
78 y.o. G2P2 Divorced White or Caucasian female here for annual exam.  Transitioned care to Urology at [facility], Dr. Thurmond Butts.  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  Has follow up planned for this year as well.  She's had three UTIs this past year.  Not symptomatic today.  Does have a lot of urinary urgency.    Denies vaginal bleeding.    Stepped in a hole that was covered with leaves and broke her ankle.  This was a clean fracture so she had to wear a boot for a month.    She has started gabapentin for headaches.  She thinks this has helped some.    She reports an infrequent perirectal bumps/blisters that have a burning sensation to them.  It does't happen very often.  She did have an episode of this a couple of weeks ago.  She has some "left over" Zovirax and used it.  She felt this really helped.  Would like rx.  Doesn't think she has any symptoms or skin changes present today.  Patient's last menstrual period was 06/19/1992.          Sexually active: No.  The current method of family planning is post menopausal status.    Exercising: Yes.    walk Smoker:  no  Health Maintenance: Pap:  01/11/16 Neg   12/18/13 Neg  History of abnormal Pap:  no MMG:  07/03/18 BIRADS1:Neg  Colonoscopy:  05/07/14 polyps.  Likely will have another one later this year. BMD:   10/15/12 osteopenia  TDaP:  2016 Pneumonia vaccine(s):  2015 Shingrix:   Is planning to get this during this year Hep C testing: n/a Screening Labs: PCP   reports that she has quit smoking. She has never used smokeless tobacco. She reports that she does not drink alcohol or use drugs.  Past Medical History:  Diagnosis Date  . Breast cancer ([place]) 1994   left breast, lumpectomy/negative nodes/tamoxifen x 5 years  . Endometrial polyp 3/02  . Hx of adenomatous colonic polyps 05/12/2014  . Hyperlipidemia   . Hypertension    no meds., pt denies  . Insomnia   .  Kidney cysts 2003   benign, fatty cyst/angiomyolipoma  . OAB (overactive bladder)   . Osteopenia   . Palpitations 07/01/2015  . Personal history of radiation therapy   . Vulvar abscess 10/97    Past Surgical History:  Procedure Laterality Date  . BREAST BIOPSY     right breast-benign growth  . BREAST LUMPECTOMY  1994   left side,radiation also  . CATARACT EXTRACTION, BILATERAL  2018  . hysteroscopic resection  4/02  . TONSILLECTOMY AND ADENOIDECTOMY      Current Outpatient Medications  Medication Sig Dispense Refill  . Ascorbic Acid (VITAMIN C PO) Take 1 tablet by mouth daily. As needed    . aspirin-acetaminophen-caffeine (EXCEDRIN MIGRAINE) 250-250-65 MG tablet Take 1 tablet by mouth every 6 (six) hours as needed for headache or migraine.    . Coenzyme Q10 (CO Q 10) 100 MG CAPS Take by mouth.    . cycloSPORINE (RESTASIS) 0.05 % ophthalmic emulsion daily.    Marland Kitchen gabapentin (NEURONTIN) 100 MG capsule TAKE ONE CAPSULE BY MOUTH AT BEDTIME AS DIRECTED    . ibuprofen (ADVIL,MOTRIN) 200 MG tablet Take 200 mg by mouth every 6 (six) hours as needed.    . Multiple Vitamins-Minerals (VISION-VITE PRESERVE PO) Take by mouth.    Marland Kitchen  rosuvastatin (CRESTOR) 5 MG tablet TAKE 1 TABLET BY MOUTH 3 TIMES A WEEK. NEEDS OFFICE VISIT 12 tablet 0  . zolpidem (AMBIEN) 10 MG tablet TAKE ONE-HALF TABLETS (5 MG) TOTAL BY MOUTH AT BEDTIME AS NEEDED FOR SLEEP 30 tablet 1   No current facility-administered medications for this visit.     Family History  Problem Relation Age of Onset  . Esophageal cancer Father   . Colon cancer Mother 84  . Ovarian cancer Sister   . Heart attack Sister   . Colon cancer Paternal Grandfather   . Irritable bowel syndrome Maternal Grandmother     Review of Systems  All other systems reviewed and are negative.   Exam:   BP 120/70 (BP Location: Right Arm, Patient Position: Sitting, Cuff Size: Large)   Pulse 68   Resp 18   Ht 5' 5.5" (1.664 m)   Wt 168 lb 9.6 oz (76.5 kg)    LMP 06/19/1992   BMI 27.63 kg/m   Height: 5' 5.5" (166.4 cm)  Ht Readings from Last 3 Encounters:  07/18/18 5' 5.5" (1.664 m)  12/14/17 5' 5.5" (1.664 m)  06/05/17 5' 5.5" (1.664 m)    General appearance: alert, cooperative and appears stated age Head: Normocephalic, without obvious abnormality, atraumatic Neck: no adenopathy, supple, symmetrical, trachea midline and thyroid normal to inspection and palpation Lungs: clear to auscultation bilaterally Breasts: normal appearance, no masses or tenderness Heart: regular rate and rhythm Abdomen: soft, non-tender; bowel sounds normal; no masses,  no organomegaly Extremities: extremities normal, atraumatic, no cyanosis or edema Skin: Skin color, texture, turgor normal. No rashes or lesions Lymph nodes: Cervical, supraclavicular, and axillary nodes normal. No abnormal inguinal nodes palpated Neurologic: Grossly normal   Pelvic: External genitalia:  no lesions              Urethra:  normal appearing urethra with no masses, tenderness or lesions              Bartholins and Skenes: normal                 Vagina: normal appearing vagina with normal color and discharge, no lesions, 3rd degree cystocele              Cervix: no lesions              Pap taken: No. Bimanual Exam:  Uterus:  normal size, contour, position, consistency, mobility, non-tender              Adnexa: normal adnexa and no mass, fullness, tenderness               Rectovaginal: deferred due to vesicular lesions that are present               Anus:  Four vesicular appearing lesions that are in later stage of healing to right of anus  Chaperone was present for exam.  A:  Well Woman with normal exam H/O Breast Cancer 1994, lobar cancer Osteopenia Cystocele Insomnia Probable vulvar HSV 3rd degree cytocele  P:   Mammogram guidelines reviewed.  Doing 3D. BMD order placed today pap smear not indicated Ambien 5mg  qhs prn insomnia.  #30/2RF Perirectal HSV PCR obtained  today Valtrex 500mg  bid x 3 days to pharmacy.  Acyclovir ointment also sent to pharmacy Urine culture pending Vaccines records signed for today to make sure pt is UTD with pneumonia vaccinations She is going to get the shingrix vaccination as well return annually or prn

## 2018-07-18 NOTE — Patient Instructions (Signed)
Outpatient Pharmacy at [facility] [address] [place],  [place]  [address]  Main: [phone] 

## 2018-07-19 LAB — URINE CULTURE: Organism ID, Bacteria: NO GROWTH

## 2018-07-20 LAB — HSV DNA BY PCR (REFERENCE LAB)
HSV 2 DNA: NEGATIVE
HSV-1 DNA: POSITIVE — AB

## 2018-07-25 ENCOUNTER — Telehealth: Payer: Self-pay | Admitting: *Deleted

## 2018-07-25 NOTE — Telephone Encounter (Signed)
LM for pt to call back.

## 2018-07-25 NOTE — Telephone Encounter (Signed)
-----   Message from Megan Salon, MD sent at 07/24/2018  2:38 PM EST ----- Please let pt know the HSV testing was positive for HSV 1 which is more often what causes facial fever blisters.  I did give her an rx for valtrex 500mg  bid x 3 days and zovirax ointment.  She can use both of these any time she has any new symptoms.  Which talked a lot about HSV when she was in the office so I think she will not have a lot of questions.  If she does, please encourage OV.  Thanks.

## 2018-07-26 NOTE — Telephone Encounter (Signed)
Patient returned call to Reina. °

## 2018-07-26 NOTE — Telephone Encounter (Signed)
Patient notified

## 2018-09-30 ENCOUNTER — Other Ambulatory Visit: Payer: Self-pay | Admitting: Obstetrics & Gynecology

## 2018-09-30 DIAGNOSIS — M8580 Other specified disorders of bone density and structure, unspecified site: Secondary | ICD-10-CM

## 2018-11-08 ENCOUNTER — Other Ambulatory Visit: Payer: Medicare Other

## 2018-11-13 ENCOUNTER — Other Ambulatory Visit: Payer: Medicare Other

## 2018-12-10 ENCOUNTER — Encounter: Payer: Self-pay | Admitting: Obstetrics & Gynecology

## 2018-12-10 ENCOUNTER — Telehealth: Payer: Self-pay | Admitting: Obstetrics & Gynecology

## 2018-12-10 NOTE — Telephone Encounter (Signed)
Medication list updated to include Claritin and fluticasone nasal spray. MyChart message to patient to notify.   Encounter closed.

## 2018-12-10 NOTE — Telephone Encounter (Signed)
Message  I submitted the form for my upcoming visit on Thursday and forgot to add two medications that I have been taking and I can't find where to edit the information. I am taking fluticasone propionate nasal spray for allergies, once a day. And Clariden.  I either take one or the the other, not both.

## 2018-12-10 NOTE — Telephone Encounter (Signed)
Patient is calling regarding a "red and blue bruise" that she found on her breast this morning. Patient stated that it is "about the size of a 50 cent piece." Patient stated that it is not sore, or hot to touch. Patient stated that its on the right breast and is about 2 inches from the nipple.

## 2018-12-10 NOTE — Telephone Encounter (Signed)
Spoke with patient. Patient reports red, blue bruise with white dots, slightly rough. Noticed this morning on right breast. Denies pain, warmth, nipple d/c, fever/chills. Has been doing more yard work, is unsure if she may have hit her breast. Is anxious, has read about skin and breast cancers, would like Dr. Sabra Heck to take a look. OV scheduled for 6/25 at 2:30pm with Dr. Sabra Heck. Covid 19 precautions reviewed, prescreen negative.   Routing to provider for final review. Patient is agreeable to disposition. Will close encounter.

## 2018-12-12 ENCOUNTER — Other Ambulatory Visit: Payer: Self-pay

## 2018-12-12 ENCOUNTER — Encounter: Payer: Self-pay | Admitting: Obstetrics & Gynecology

## 2018-12-12 ENCOUNTER — Ambulatory Visit: Payer: Medicare Other | Admitting: Obstetrics & Gynecology

## 2018-12-12 ENCOUNTER — Telehealth: Payer: Self-pay | Admitting: *Deleted

## 2018-12-12 VITALS — BP 134/80 | HR 72 | Temp 97.2°F | Ht 65.5 in | Wt 174.4 lb

## 2018-12-12 DIAGNOSIS — N6489 Other specified disorders of breast: Secondary | ICD-10-CM

## 2018-12-12 DIAGNOSIS — W57XXXA Bitten or stung by nonvenomous insect and other nonvenomous arthropods, initial encounter: Secondary | ICD-10-CM

## 2018-12-12 DIAGNOSIS — N63 Unspecified lump in unspecified breast: Secondary | ICD-10-CM | POA: Diagnosis not present

## 2018-12-12 DIAGNOSIS — S2000XA Contusion of breast, unspecified breast, initial encounter: Secondary | ICD-10-CM

## 2018-12-12 DIAGNOSIS — R350 Frequency of micturition: Secondary | ICD-10-CM | POA: Diagnosis not present

## 2018-12-12 LAB — POCT URINALYSIS DIPSTICK
Bilirubin, UA: NEGATIVE
Glucose, UA: NEGATIVE
Nitrite, UA: NEGATIVE
Protein, UA: NEGATIVE
Urobilinogen, UA: 0.2 E.U./dL
pH, UA: 5 (ref 5.0–8.0)

## 2018-12-12 NOTE — Progress Notes (Signed)
GYNECOLOGY  VISIT  CC:   Bruise under right breast noticed 2 days ago  HPI: 78 y.o. G2P2 Divorced White or Caucasian female here for right breast bruise.  She's been resistant to going to her dermatologist so she decided to do self skin survey with a mirror.  He saw a large bruise under the right breast and feels the right breast is enlarged.  She decided to have this evaluated.  Denies recent trauma on the right breast.  She is not taking any aspirin.  Denies nipple discharge.  Last MMG 07/03/2018.    Also has a mole on the back of her knee that she would like me to assess.  GYNECOLOGIC HISTORY: Patient's last menstrual period was 06/19/1992. Contraception: PMP Menopausal hormone therapy: none  Patient Active Problem List   Diagnosis Date Noted  . Palpitations 07/01/2015  . Hx of adenomatous colonic polyps 05/12/2014  . Pure hypercholesterolemia 01/30/2012  . ADENOCARCINOMA, LEFT BREAST 03/01/2009    Past Medical History:  Diagnosis Date  . Breast cancer ([place]) 1994   left breast, lumpectomy/negative nodes/tamoxifen x 5 years  . Endometrial polyp 3/02  . Hx of adenomatous colonic polyps 05/12/2014  . Hyperlipidemia   . Hypertension    no meds., pt denies  . Insomnia   . Kidney cysts 2003   benign, fatty cyst/angiomyolipoma  . OAB (overactive bladder)   . Osteopenia   . Palpitations 07/01/2015  . Personal history of radiation therapy   . Vulvar abscess 10/97    Past Surgical History:  Procedure Laterality Date  . BREAST BIOPSY     right breast-benign growth  . BREAST LUMPECTOMY  1994   left side,radiation also  . CATARACT EXTRACTION, BILATERAL  2018  . hysteroscopic resection  4/02  . TONSILLECTOMY AND ADENOIDECTOMY      MEDS:   Current Outpatient Medications on File Prior to Visit  Medication Sig Dispense Refill  . Ascorbic Acid (VITAMIN C PO) Take 1 tablet by mouth daily. As needed    . Coenzyme Q10 (CO Q 10) 100 MG CAPS Take by mouth.    . fluticasone  (FLONASE) 50 MCG/ACT nasal spray Place 1 spray into both nostrils daily.    Marland Kitchen gabapentin (NEURONTIN) 100 MG capsule TAKE ONE CAPSULE BY MOUTH AT BEDTIME AS DIRECTED    . Multiple Vitamins-Minerals (VISION-VITE PRESERVE PO) Take by mouth.    . rosuvastatin (CRESTOR) 5 MG tablet TAKE 1 TABLET BY MOUTH 3 TIMES A WEEK. NEEDS OFFICE VISIT 12 tablet 0  . zolpidem (AMBIEN) 10 MG tablet 1/2 tab qhs prn insomnia 30 tablet 1   No current facility-administered medications on file prior to visit.     ALLERGIES: Codeine  Family History  Problem Relation Age of Onset  . Esophageal cancer Father   . Colon cancer Mother 43  . Ovarian cancer Sister   . Heart attack Sister   . Colon cancer Paternal Grandfather   . Irritable bowel syndrome Maternal Grandmother     SH:  Divorced, non smoker  Review of Systems  Genitourinary: Positive for frequency.  Hematological: Bruises/bleeds easily.  All other systems reviewed and are negative.   PHYSICAL EXAMINATION:    BP 134/80   Pulse 72   Temp (!) 97.2 F (36.2 C) (Temporal)   Ht 5' 5.5" (1.664 m)   Wt 174 lb 6.4 oz (79.1 kg)   LMP 06/19/1992   BMI 28.58 kg/m     Physical Exam  Constitutional: She is oriented to person, place,  and time. She appears well-developed and well-nourished.  Neck: Normal range of motion. Neck supple. No thyromegaly present.  Respiratory: Right breast exhibits mass and skin change. Right breast exhibits no inverted nipple, no nipple discharge and no tenderness. Left breast exhibits no inverted nipple, no mass, no nipple discharge, no skin change and no tenderness. Breasts are symmetrical.    Lymphadenopathy:    She has no axillary adenopathy.       Right: No supraclavicular adenopathy present.       Left: No supraclavicular adenopathy present.  Neurological: She is alert and oriented to person, place, and time.  Skin: Skin is warm and dry.     Psychiatric: She has a normal mood and affect.    Chaperone was  present for exam.  Assessment: Right breast bruising with nodule beneath bruise Tick removed today  Plan: Diagnostic right breast imaging will be scheduled Pt is to watch area where tick was removed (did not appear to be a deer tick due to size) and call with any changes

## 2018-12-12 NOTE — Telephone Encounter (Signed)
-----   Message from Megan Salon, MD sent at 12/12/2018  2:55 PM EDT ----- Regarding: diagnostic MMG Please scheduled at diagnostic Right MMG for pt with right breast bruise and small breast nodule.  She prefers earliest possible appt.  Thanks.

## 2018-12-12 NOTE — Telephone Encounter (Addendum)
Spoke with patient, notified of appointment. Patient is agreeable to date and time.   Routing to provider for final review. Patient is agreeable to disposition. Will close encounter.

## 2018-12-12 NOTE — Telephone Encounter (Signed)
Spoke with Joaquim Lai at [facility]. Right breast dx MMG and [place], if needed, scheduled for 12/19/18 at 7:50am, arriving at 7:30am.

## 2018-12-14 LAB — URINE CULTURE

## 2018-12-19 ENCOUNTER — Other Ambulatory Visit: Payer: Self-pay

## 2018-12-19 ENCOUNTER — Ambulatory Visit
Admission: RE | Admit: 2018-12-19 | Discharge: 2018-12-19 | Disposition: A | Discharge status: Home / Self Care | Payer: Medicare Other | Source: Ambulatory Visit | Attending: Obstetrics & Gynecology | Admitting: Obstetrics & Gynecology

## 2018-12-19 DIAGNOSIS — N6489 Other specified disorders of breast: Secondary | ICD-10-CM

## 2018-12-19 DIAGNOSIS — N63 Unspecified lump in unspecified breast: Secondary | ICD-10-CM

## 2018-12-19 DIAGNOSIS — S2000XA Contusion of breast, unspecified breast, initial encounter: Secondary | ICD-10-CM

## 2019-01-01 ENCOUNTER — Other Ambulatory Visit: Payer: Medicare Other

## 2019-03-07 ENCOUNTER — Other Ambulatory Visit: Payer: Self-pay

## 2019-03-07 MED ORDER — ZOLPIDEM TARTRATE 10 MG PO TABS: ORAL_TABLET | ORAL | 1 refills | Status: DC

## 2019-03-07 NOTE — Telephone Encounter (Signed)
Medication refill request: zolpidem tartrate 10mg  Last AEX:  07-18-2018 Next AEX: 11-27-2019 Last MMG (if hormonal medication request): n/a Refill authorized: please approve if appropriate

## 2019-05-11 ENCOUNTER — Encounter: Payer: Self-pay | Admitting: Internal Medicine

## 2019-05-30 ENCOUNTER — Other Ambulatory Visit: Payer: Self-pay | Admitting: Obstetrics & Gynecology

## 2019-05-30 ENCOUNTER — Encounter: Payer: Self-pay | Admitting: Obstetrics & Gynecology

## 2019-05-30 DIAGNOSIS — Z1231 Encounter for screening mammogram for malignant neoplasm of breast: Secondary | ICD-10-CM

## 2019-07-03 ENCOUNTER — Ambulatory Visit: Payer: Medicare Other | Attending: Internal Medicine

## 2019-07-03 DIAGNOSIS — Z23 Encounter for immunization: Secondary | ICD-10-CM | POA: Insufficient documentation

## 2019-07-03 NOTE — Progress Notes (Signed)
   Covid-19 Vaccination Clinic  Name:  PAISLEA ANHORN    MRN: [id number] DOB: [birth year]  07/03/2019   Pt vaccinated as stated below. Pt walked out before DC. RN not sure if patient walked out after vaccination or during obervation  Immunizations Administered    Name Date Dose VIS Date [place] COVID-19 Vaccine 07/03/2019  9:50 AM 0.3 mL 05/30/2019 Intramuscular   Manufacturer: [place]   Lot: [id number]   [place]: [id number]

## 2019-07-13 ENCOUNTER — Encounter: Payer: Self-pay | Admitting: Obstetrics & Gynecology

## 2019-07-21 ENCOUNTER — Ambulatory Visit: Payer: Medicare Other

## 2019-07-21 ENCOUNTER — Ambulatory Visit: Payer: Medicare PPO | Attending: Internal Medicine

## 2019-07-21 DIAGNOSIS — Z23 Encounter for immunization: Secondary | ICD-10-CM | POA: Insufficient documentation

## 2019-07-21 NOTE — Progress Notes (Signed)
   Covid-19 Vaccination Clinic  Name:  Jasmine Payne    MRN: [id number] DOB: 1941-05-18  07/21/2019  [name] was observed post Covid-19 immunization for 15 minutes without incidence. She was provided with Vaccine Information Sheet and instruction to access the V-Safe system.   [name] was instructed to call 911 with any severe reactions post vaccine: Marland Kitchen Difficulty breathing  . Swelling of your face and throat  . A fast heartbeat  . A bad rash all over your body  . Dizziness and weakness    Immunizations Administered    Name Date Dose VIS Date Route   Pfizer COVID-19 Vaccine 07/21/2019  9:25 AM 0.3 mL 05/30/2019 Intramuscular   Manufacturer: [place]   Lot: [id number]   [place]: [id number]

## 2019-08-05 ENCOUNTER — Other Ambulatory Visit: Payer: Self-pay

## 2019-08-05 ENCOUNTER — Ambulatory Visit
Admission: RE | Admit: 2019-08-05 | Discharge: 2019-08-05 | Disposition: A | Discharge status: Home / Self Care | Payer: Medicare PPO | Source: Ambulatory Visit | Attending: Obstetrics & Gynecology | Admitting: Obstetrics & Gynecology

## 2019-08-05 DIAGNOSIS — M8580 Other specified disorders of bone density and structure, unspecified site: Secondary | ICD-10-CM

## 2019-08-05 DIAGNOSIS — Z1231 Encounter for screening mammogram for malignant neoplasm of breast: Secondary | ICD-10-CM

## 2019-08-07 ENCOUNTER — Ambulatory Visit: Payer: Medicare PPO

## 2019-11-20 ENCOUNTER — Ambulatory Visit: Payer: Medicare Other

## 2019-11-25 NOTE — Progress Notes (Signed)
79 y.o. G2P2 Divorced White or Caucasian female here for annual exam.  Doing well.  Denies vaginal bleeding.  Did have her Covid vaccination.  Not having any new bladder issues.  Knows she needs a colonoscopy.  Was due last November.    PCP:  Dr. Virgina Jock.  Has phone visit.  Has appt scheduled for August.  Will have blood work done a week before the appt.    Patient's last menstrual period was 06/19/1992.          Sexually active: No.  The current method of family planning is post menopausal status.    Exercising: Yes.    walking Smoker:  no  Health Maintenance: Pap: 12-18-13 neg, 01-11-16 neg History of abnormal Pap:  no MMG:  08-05-2019 category b density birads 1:neg Colonoscopy:  05-07-14 polyps BMD:   08-05-2019 TDaP:  2014 Pneumonia vaccine(s):  2016 Shingrix:   Not done Hep C testing: not done Screening Labs: planned to be done in August   reports that she has quit smoking. She has never used smokeless tobacco. She reports that she does not drink alcohol and does not use drugs.  Past Medical History:  Diagnosis Date  . Breast cancer ([place]) 1994   left breast, lumpectomy/negative nodes/tamoxifen x 5 years  . Endometrial polyp 3/02  . Hx of adenomatous colonic polyps 05/12/2014  . Hyperlipidemia   . Hypertension    no meds., pt denies  . Insomnia   . Kidney cysts 2003   benign, fatty cyst/angiomyolipoma  . OAB (overactive bladder)   . Osteopenia   . Palpitations 07/01/2015  . Personal history of radiation therapy   . Vulvar abscess 10/97    Past Surgical History:  Procedure Laterality Date  . BREAST BIOPSY     right breast-benign growth  . BREAST LUMPECTOMY  1994   left side,radiation also  . CATARACT EXTRACTION, BILATERAL  2018  . hysteroscopic resection  4/02  . TONSILLECTOMY AND ADENOIDECTOMY      Current Outpatient Medications  Medication Sig Dispense Refill  . Coenzyme Q10 (CO Q 10) 100 MG CAPS Take by mouth.    . gabapentin (NEURONTIN) 100 MG capsule TAKE  ONE CAPSULE BY MOUTH AT BEDTIME AS DIRECTED    . Multiple Vitamins-Minerals (VISION-VITE PRESERVE PO) Take by mouth.    . RESTASIS 0.05 % ophthalmic emulsion     . rosuvastatin (CRESTOR) 5 MG tablet TAKE 1 TABLET BY MOUTH 3 TIMES A WEEK. NEEDS OFFICE VISIT 12 tablet 0  . zolpidem (AMBIEN) 10 MG tablet 1/2 tab qhs prn insomnia 30 tablet 1   No current facility-administered medications for this visit.    Family History  Problem Relation Age of Onset  . Esophageal cancer Father   . Colon cancer Mother 57  . Ovarian cancer Sister   . Heart attack Sister   . Colon cancer Paternal Grandfather   . Irritable bowel syndrome Maternal Grandmother     Review of Systems  Constitutional: Negative.   HENT: Negative.   Eyes: Negative.   Respiratory: Negative.   Cardiovascular: Negative.   Gastrointestinal: Negative.   Endocrine: Negative.   Genitourinary: Negative.   Musculoskeletal: Negative.   Skin: Negative.   Allergic/Immunologic: Negative.   Neurological: Negative.   Hematological: Negative.   Psychiatric/Behavioral: Negative.     Exam:   BP 108/64   Pulse 68   Temp (!) 97.2 F (36.2 C) (Skin)   Resp 16   Ht 5' 5.25" (1.657 m)   Wt 171  lb (77.6 kg)   LMP 06/19/1992   BMI 28.24 kg/m   Height: 5' 5.25" (165.7 cm)  General appearance: alert, cooperative and appears stated age Head: Normocephalic, without obvious abnormality, atraumatic Neck: no adenopathy, supple, symmetrical, trachea midline and thyroid normal to inspection and palpation Lungs: clear to auscultation bilaterally Breasts: normal appearance, no masses or tenderness Heart: regular rate and rhythm Abdomen: soft, non-tender; bowel sounds normal; no masses,  no organomegaly Extremities: extremities normal, atraumatic, no cyanosis or edema Skin: Skin color, texture, turgor normal. No rashes or lesions Lymph nodes: Cervical, supraclavicular, and axillary nodes normal. No abnormal inguinal nodes  palpated Neurologic: Grossly normal   Pelvic: External genitalia:  no lesions              Urethra:  normal appearing urethra with no masses, tenderness or lesions              Bartholins and Skenes: normal                 Vagina: normal appearing vagina with normal color and discharge, no lesions              Cervix: no lesions              Pap taken: No. Bimanual Exam:  Uterus:  normal size, contour, position, consistency, mobility, non-tender              Adnexa: normal adnexa and no mass, fullness, tenderness               Rectovaginal: Confirms               Anus:  normal sphincter tone, no lesions  Chaperone, Terence Lux, CMA, was present for exam.  A:  Well Woman with normal exam H/o lobar breast cancer 1994 PMP, no HRT Osteopenia Chronic insomnia Angiomyolipoma of kidney  P:   Mammogram guidelines reviewed pap smear not indicated Colonoscopy is due.  Pt is aware.  She is not sure she wants to do this right now but is aware it is due Did not follow up with [facility] urology last year but does plan to do this in 2021 Lab work done with Dr. Virgina Jock RF for Ambien 5mg  nightly prn.  She does not use this regularly.  #30/0RF Return annually or prn

## 2019-11-26 ENCOUNTER — Other Ambulatory Visit: Payer: Self-pay

## 2019-11-27 ENCOUNTER — Encounter: Payer: Self-pay | Admitting: Obstetrics & Gynecology

## 2019-11-27 ENCOUNTER — Other Ambulatory Visit: Payer: Self-pay

## 2019-11-27 ENCOUNTER — Ambulatory Visit (INDEPENDENT_AMBULATORY_CARE_PROVIDER_SITE_OTHER): Payer: Medicare PPO | Admitting: Obstetrics & Gynecology

## 2019-11-27 VITALS — BP 108/64 | HR 68 | Temp 97.2°F | Resp 16 | Ht 65.25 in | Wt 171.0 lb

## 2019-11-27 DIAGNOSIS — Z01419 Encounter for gynecological examination (general) (routine) without abnormal findings: Secondary | ICD-10-CM | POA: Diagnosis not present

## 2019-11-27 MED ORDER — ZOLPIDEM TARTRATE 5 MG PO TABS: ORAL_TABLET | ORAL | 0 refills | Status: DC

## 2020-01-19 ENCOUNTER — Encounter: Payer: Self-pay | Admitting: Obstetrics & Gynecology

## 2020-01-20 ENCOUNTER — Other Ambulatory Visit: Payer: Self-pay | Admitting: Obstetrics & Gynecology

## 2020-01-20 ENCOUNTER — Encounter: Payer: Self-pay | Admitting: Internal Medicine

## 2020-01-20 MED ORDER — VALACYCLOVIR HCL 500 MG PO TABS: 500.0000 mg | ORAL_TABLET | Freq: Two times a day (BID) | ORAL | 2 refills | Status: DC

## 2020-02-29 ENCOUNTER — Other Ambulatory Visit: Payer: Self-pay | Admitting: Obstetrics & Gynecology

## 2020-03-01 NOTE — Telephone Encounter (Signed)
Medication refill request: ambien 5mg  Last AEX:  11-27-2019 Next AEX: 01-27-2021 Last MMG (if hormonal medication request): n/a Refill authorized: please approve if appropriate

## 2020-03-29 ENCOUNTER — Ambulatory Visit (AMBULATORY_SURGERY_CENTER): Payer: Self-pay | Admitting: *Deleted

## 2020-03-29 ENCOUNTER — Encounter: Payer: Self-pay | Admitting: Gastroenterology

## 2020-03-29 ENCOUNTER — Other Ambulatory Visit: Payer: Self-pay

## 2020-03-29 VITALS — Ht 65.25 in | Wt 177.0 lb

## 2020-03-29 DIAGNOSIS — Z86010 Personal history of colonic polyps: Secondary | ICD-10-CM

## 2020-03-29 DIAGNOSIS — Z800 Family history of malignant neoplasm of digestive organs: Secondary | ICD-10-CM

## 2020-03-29 MED ORDER — SUPREP BOWEL PREP KIT 17.5-3.13-1.6 GM/177ML PO SOLN: 1.0000 | Freq: Once | ORAL | 0 refills | Status: AC

## 2020-03-29 NOTE — Progress Notes (Signed)
cov vax x 3+ booster  No egg or soy allergy known to patient  No issues with past sedation with any surgeries or procedures no intubation problems in the past  No FH of Malignant Hyperthermia No diet pills per patient No home 02 use per patient  No blood thinners per patient  Pt denies issues with constipation  No A fib or A flutter  EMMI video to pt or via [place] 19 guidelines implemented in PV today with Pt and RN    Due to the COVID-19 pandemic we are asking patients to follow these guidelines. Please only bring one care partner. Please be aware that your care partner may wait in the car in the parking lot or if they feel like they will be too hot to wait in the car, they may wait in the lobby on the 4th floor. All care partners are required to wear a mask the entire time (we do not have any that we can provide them), they need to practice social distancing, and we will do a Covid check for all patient's and care partners when you arrive. Also we will check their temperature and your temperature. If the care partner waits in their car they need to stay in the parking lot the entire time and we will call them on their cell phone when the patient is ready for discharge so they can bring the car to the front of the building. Also all patient's will need to wear a mask into building.

## 2020-03-31 ENCOUNTER — Telehealth: Payer: Self-pay

## 2020-03-31 ENCOUNTER — Encounter: Payer: Self-pay | Admitting: Obstetrics & Gynecology

## 2020-03-31 NOTE — Telephone Encounter (Signed)
Patient returned call

## 2020-03-31 NOTE — Telephone Encounter (Signed)
Patient returning call.

## 2020-03-31 NOTE — Telephone Encounter (Signed)
Patient would like to speak to nurse about a prescription for hemorrhoids.

## 2020-03-31 NOTE — Telephone Encounter (Signed)
Left message to call Sharee Pimple, RN at [place].   Last AEX 11/27/19 w/ Dr. Sabra Heck

## 2020-03-31 NOTE — Telephone Encounter (Signed)
Spoke with patient. Patient reports hemorrhoids that are swollen and itchy. OTC medication is not providing much relief. Patient is scheduled for a colonoscopy next week, but since her initial call she has learned that her daughter will be having surgery next week, so she needs to cancel her appt. Patient denies pain or bleeding. Offered OV with Dr. Sabra Heck, patient declined. Patient is going to talk with GI about tx options when she calls to r/s her colonoscopy. Patient will return call to office for OV if still needed. Patient thankful for call.   Routing to provider for final review. Patient is agreeable to disposition. Will close encounter.

## 2020-03-31 NOTE — Telephone Encounter (Signed)
Jasmine Payne Clinical Pool I have a colonoscopy scheduled next Wed. , 20th, and I am having a little discomfort from hemorrhoids (mostly swelling) that over the counter stuff isn't helping.  Could you prescribe something? [facility]

## 2020-04-07 ENCOUNTER — Encounter: Payer: Medicare PPO | Admitting: Internal Medicine

## 2020-04-07 ENCOUNTER — Encounter: Payer: Medicare PPO | Admitting: Gastroenterology

## 2020-05-05 ENCOUNTER — Ambulatory Visit (AMBULATORY_SURGERY_CENTER): Payer: Medicare PPO | Admitting: Gastroenterology

## 2020-05-05 ENCOUNTER — Encounter: Payer: Self-pay | Admitting: Gastroenterology

## 2020-05-05 ENCOUNTER — Other Ambulatory Visit: Payer: Self-pay

## 2020-05-05 VITALS — BP 139/76 | HR 61 | Temp 98.9°F | Resp 18 | Ht 65.0 in | Wt 177.0 lb

## 2020-05-05 DIAGNOSIS — Z86010 Personal history of colonic polyps: Secondary | ICD-10-CM

## 2020-05-05 DIAGNOSIS — Z800 Family history of malignant neoplasm of digestive organs: Secondary | ICD-10-CM | POA: Diagnosis not present

## 2020-05-05 DIAGNOSIS — Z1211 Encounter for screening for malignant neoplasm of colon: Secondary | ICD-10-CM | POA: Diagnosis not present

## 2020-05-05 DIAGNOSIS — D123 Benign neoplasm of transverse colon: Secondary | ICD-10-CM | POA: Diagnosis not present

## 2020-05-05 DIAGNOSIS — D124 Benign neoplasm of descending colon: Secondary | ICD-10-CM

## 2020-05-05 DIAGNOSIS — K635 Polyp of colon: Secondary | ICD-10-CM | POA: Diagnosis not present

## 2020-05-05 MED ORDER — SODIUM CHLORIDE 0.9 % IV SOLN: 500.0000 mL | Freq: Once | INTRAVENOUS | Status: DC

## 2020-05-05 NOTE — Progress Notes (Signed)
C.W. vital signs. 

## 2020-05-05 NOTE — Progress Notes (Signed)
Called to room to assist during endoscopic procedure.  Patient ID and intended procedure confirmed with present staff. Received instructions for my participation in the procedure from the performing physician.  

## 2020-05-05 NOTE — Patient Instructions (Signed)
Please read handouts provided. Continue present medications. Await pathology results. High Fiber Diet. No aspirin. Ibuprofen, naproxen, or other non-steriodal anti-inflammatory drugs for 2 weeks.     YOU HAD AN ENDOSCOPIC PROCEDURE TODAY AT [facility]:   Refer to the procedure report that was given to you for any specific questions about what was found during the examination.  If the procedure report does not answer your questions, please call your gastroenterologist to clarify.  If you requested that your care partner not be given the details of your procedure findings, then the procedure report has been included in a sealed envelope for you to review at your convenience later.  YOU SHOULD EXPECT: Some feelings of bloating in the abdomen. Passage of more gas than usual.  Walking can help get rid of the air that was put into your GI tract during the procedure and reduce the bloating. If you had a lower endoscopy (such as a colonoscopy or flexible sigmoidoscopy) you may notice spotting of blood in your stool or on the toilet paper. If you underwent a bowel prep for your procedure, you may not have a normal bowel movement for a few days.  Please Note:  You might notice some irritation and congestion in your nose or some drainage.  This is from the oxygen used during your procedure.  There is no need for concern and it should clear up in a day or so.  SYMPTOMS TO REPORT IMMEDIATELY:   Following lower endoscopy (colonoscopy or flexible sigmoidoscopy):  Excessive amounts of blood in the stool  Significant tenderness or worsening of abdominal pains  Swelling of the abdomen that is new, acute  Fever of 100F or higher   For urgent or emergent issues, a gastroenterologist can be reached at any hour by calling [phone]. Do not use MyChart messaging for urgent concerns.    DIET:  We do recommend a small meal at first, but then you may proceed to your regular diet.  Drink  plenty of fluids but you should avoid alcoholic beverages for 24 hours.  ACTIVITY:  You should plan to take it easy for the rest of today and you should NOT DRIVE or use heavy machinery until tomorrow (because of the sedation medicines used during the test).    FOLLOW UP: Our staff will call the number listed on your records 48-72 hours following your procedure to check on you and address any questions or concerns that you may have regarding the information given to you following your procedure. If we do not reach you, we will leave a message.  We will attempt to reach you two times.  During this call, we will ask if you have developed any symptoms of COVID 19. If you develop any symptoms (ie: fever, flu-like symptoms, shortness of breath, cough etc.) before then, please call [phone].  If you test positive for Covid 19 in the 2 weeks post procedure, please call and report this information to [place].    If any biopsies were taken you will be contacted by phone or by letter within the next 1-3 weeks.  Please call us at [phone] if you have not heard about the biopsies in 3 weeks.    SIGNATURES/CONFIDENTIALITY: You and/or your care partner have signed paperwork which will be entered into your electronic medical record.  These signatures attest to the fact that that the information above on your After Visit Summary has been reviewed and is understood.  Full responsibility of the  confidentiality of this discharge information lies with you and/or your care-partner. 

## 2020-05-05 NOTE — Progress Notes (Signed)
To PACU, VSS. Report to [email or web address] 

## 2020-05-05 NOTE — Op Note (Signed)
[place] Patient Name: Jasmine Payne Procedure Date: 05/05/2020 3:23 PM MRN: [id number] Endoscopist: Ladene Artist , MD Age: 79 Referring MD:  Date of Birth: [birth year] Gender: Female Account #: [id number] Procedure:                Colonoscopy Indications:              Surveillance: Personal history of adenomatous                            polyps on last colonoscopy > 5 years ago. Familly                            history of colon cancer, first degree relative. Medicines:                Monitored Anesthesia Care Procedure:                Pre-Anesthesia Assessment:                           - Prior to the procedure, a History and Physical                            was performed, and patient medications and                            allergies were reviewed. The patient's tolerance of                            previous anesthesia was also reviewed. The risks                            and benefits of the procedure and the sedation                            options and risks were discussed with the patient.                            All questions were answered, and informed consent                            was obtained. Prior Anticoagulants: The patient has                            taken no previous anticoagulant or antiplatelet                            agents. ASA Grade Assessment: II - A patient with                            mild systemic disease. After reviewing the risks                            and benefits, the patient was deemed in  satisfactory condition to undergo the procedure.                           After obtaining informed consent, the colonoscope                            was passed under direct vision. Throughout the                            procedure, the patient's blood pressure, pulse, and                            oxygen saturations were monitored continuously. The                            Colonoscope was  introduced through the anus and                            advanced to the the cecum, identified by                            appendiceal orifice and ileocecal valve. The                            ileocecal valve, appendiceal orifice, and rectum                            were photographed. The quality of the bowel                            preparation was good. The colonoscopy was performed                            without difficulty. The patient tolerated the                            procedure well. Scope In: 3:36:00 PM Scope Out: 4:03:07 PM Scope Withdrawal Time: 0 hours 21 minutes 24 seconds  Total Procedure Duration: 0 hours 27 minutes 7 seconds  Findings:                 The perianal and digital rectal examinations were                            normal.                           A 15 mm polyp was found in the distal transverse                            colon. The polyp was sessile. The polyp was removed                            with a cold snare. Resection and retrieval were  complete.                           Four sessile polyps were found in the descending                            colon (1) and transverse colon (3). The polyps were                            6 to 8 mm in size. These polyps were removed with a                            cold snare. Resection and retrieval were complete.                           Multiple medium-mouthed diverticula were found in                            the sigmoid colon.                           Internal hemorrhoids were found during                            retroflexion. The hemorrhoids were small and Grade                            I (internal hemorrhoids that do not prolapse).                           The exam was otherwise without abnormality on                            direct and retroflexion views. Complications:            No immediate complications. Estimated blood loss:                             None. Estimated Blood Loss:     Estimated blood loss: none. Impression:               - One 15 mm polyp in the distal transverse colon,                            removed with a cold snare. Resected and retrieved.                           - Four 6 to 8 mm polyps in the descending colon and                            in the transverse colon, removed with a cold snare.                            Resected and retrieved.                           -  Mild diverticulosis in the sigmoid colon.                           - Internal hemorrhoids.                           - The examination was otherwise normal on direct                            and retroflexion views. Recommendation:           - Patient has a contact number available for                            emergencies. The signs and symptoms of potential                            delayed complications were discussed with the                            patient. Return to normal activities tomorrow.                            Written discharge instructions were provided to the                            patient.                           - High fiber diet.                           - Continue present medications.                           - Await pathology results.                           - No aspirin, ibuprofen, naproxen, or other                            non-steroidal anti-inflammatory drugs for 2 weeks                            after polyp removal.                           - No repeat colonoscopy due to age. Ladene Artist, MD 05/05/2020 4:09:13 PM This report has been signed electronically.

## 2020-05-05 NOTE — Progress Notes (Signed)
Pt's states no medical or surgical changes since previsit or office visit. 

## 2020-05-07 ENCOUNTER — Telehealth: Payer: Self-pay

## 2020-05-07 NOTE — Telephone Encounter (Signed)
  Follow up Call-  Call back number 05/05/2020  Post procedure Call Back phone  # [phone]  Permission to leave phone message Yes  Some recent data might be hidden     Patient questions:  Do you have a fever, pain , or abdominal swelling? No. Pain Score  0 *  Have you tolerated food without any problems? Yes.    Have you been able to return to your normal activities? Yes.    Do you have any questions about your discharge instructions: Diet   No. Medications  No. Follow up visit  No.  Do you have questions or concerns about your Care? No.  Actions: * If pain score is 4 or above: No action needed, pain <4.   1. Have you developed a fever since your procedure? no  2.   Have you had an respiratory symptoms (SOB or cough) since your procedure? no  3.   Have you tested positive for COVID 19 since your procedure no  4.   Have you had any family members/close contacts diagnosed with the COVID 19 since your procedure?  no   If yes to any of these questions please route to Joylene John, RN and Joella Prince, RN

## 2020-05-18 ENCOUNTER — Telehealth: Payer: Self-pay | Admitting: Gastroenterology

## 2020-05-25 ENCOUNTER — Encounter: Payer: Self-pay | Admitting: Gastroenterology

## 2020-06-22 ENCOUNTER — Other Ambulatory Visit: Payer: Self-pay | Admitting: Obstetrics & Gynecology

## 2020-06-22 DIAGNOSIS — Z1231 Encounter for screening mammogram for malignant neoplasm of breast: Secondary | ICD-10-CM

## 2020-07-26 DIAGNOSIS — K1120 Sialoadenitis, unspecified: Secondary | ICD-10-CM | POA: Diagnosis not present

## 2020-07-26 DIAGNOSIS — B019 Varicella without complication: Secondary | ICD-10-CM | POA: Diagnosis not present

## 2020-08-05 ENCOUNTER — Inpatient Hospital Stay: Admission: RE | Admit: 2020-08-05 | Payer: Medicare PPO | Source: Ambulatory Visit

## 2020-08-07 ENCOUNTER — Other Ambulatory Visit: Payer: Self-pay

## 2020-08-07 ENCOUNTER — Ambulatory Visit
Admission: RE | Admit: 2020-08-07 | Discharge: 2020-08-07 | Disposition: A | Discharge status: Home / Self Care | Payer: Medicare PPO | Source: Ambulatory Visit | Attending: Obstetrics & Gynecology | Admitting: Obstetrics & Gynecology

## 2020-08-07 DIAGNOSIS — Z1231 Encounter for screening mammogram for malignant neoplasm of breast: Secondary | ICD-10-CM

## 2020-09-05 DIAGNOSIS — H1031 Unspecified acute conjunctivitis, right eye: Secondary | ICD-10-CM | POA: Diagnosis not present

## 2020-09-05 DIAGNOSIS — H5789 Other specified disorders of eye and adnexa: Secondary | ICD-10-CM | POA: Diagnosis not present

## 2020-11-16 DIAGNOSIS — L720 Epidermal cyst: Secondary | ICD-10-CM | POA: Diagnosis not present

## 2020-11-16 DIAGNOSIS — L570 Actinic keratosis: Secondary | ICD-10-CM | POA: Diagnosis not present

## 2020-11-16 DIAGNOSIS — D1721 Benign lipomatous neoplasm of skin and subcutaneous tissue of right arm: Secondary | ICD-10-CM | POA: Diagnosis not present

## 2020-11-16 DIAGNOSIS — D2261 Melanocytic nevi of right upper limb, including shoulder: Secondary | ICD-10-CM | POA: Diagnosis not present

## 2020-11-16 DIAGNOSIS — L820 Inflamed seborrheic keratosis: Secondary | ICD-10-CM | POA: Diagnosis not present

## 2020-11-16 DIAGNOSIS — Z85828 Personal history of other malignant neoplasm of skin: Secondary | ICD-10-CM | POA: Diagnosis not present

## 2020-11-25 DIAGNOSIS — D1801 Hemangioma of skin and subcutaneous tissue: Secondary | ICD-10-CM | POA: Diagnosis not present

## 2020-11-25 DIAGNOSIS — Z85828 Personal history of other malignant neoplasm of skin: Secondary | ICD-10-CM | POA: Diagnosis not present

## 2020-11-25 DIAGNOSIS — L814 Other melanin hyperpigmentation: Secondary | ICD-10-CM | POA: Diagnosis not present

## 2020-11-25 DIAGNOSIS — D485 Neoplasm of uncertain behavior of skin: Secondary | ICD-10-CM | POA: Diagnosis not present

## 2020-11-25 DIAGNOSIS — D692 Other nonthrombocytopenic purpura: Secondary | ICD-10-CM | POA: Diagnosis not present

## 2020-11-25 DIAGNOSIS — D2261 Melanocytic nevi of right upper limb, including shoulder: Secondary | ICD-10-CM | POA: Diagnosis not present

## 2020-11-25 DIAGNOSIS — C44519 Basal cell carcinoma of skin of other part of trunk: Secondary | ICD-10-CM | POA: Diagnosis not present

## 2020-11-25 DIAGNOSIS — L738 Other specified follicular disorders: Secondary | ICD-10-CM | POA: Diagnosis not present

## 2020-11-25 DIAGNOSIS — D2239 Melanocytic nevi of other parts of face: Secondary | ICD-10-CM | POA: Diagnosis not present

## 2020-11-25 DIAGNOSIS — D2361 Other benign neoplasm of skin of right upper limb, including shoulder: Secondary | ICD-10-CM | POA: Diagnosis not present

## 2020-12-30 DIAGNOSIS — C44519 Basal cell carcinoma of skin of other part of trunk: Secondary | ICD-10-CM | POA: Diagnosis not present

## 2021-01-04 ENCOUNTER — Telehealth: Payer: Self-pay | Admitting: *Deleted

## 2021-01-04 NOTE — Telephone Encounter (Signed)
Patient called requesting Rx for acyclovir ointment,has used the cream in the past, reports it was too messy. Would prefer to try ointment, has annual exam scheduled with you on 01/21/21. Please advise

## 2021-01-06 DIAGNOSIS — H43822 Vitreomacular adhesion, left eye: Secondary | ICD-10-CM | POA: Diagnosis not present

## 2021-01-06 DIAGNOSIS — Z961 Presence of intraocular lens: Secondary | ICD-10-CM | POA: Diagnosis not present

## 2021-01-06 DIAGNOSIS — H353131 Nonexudative age-related macular degeneration, bilateral, early dry stage: Secondary | ICD-10-CM | POA: Diagnosis not present

## 2021-01-06 DIAGNOSIS — H04123 Dry eye syndrome of bilateral lacrimal glands: Secondary | ICD-10-CM | POA: Diagnosis not present

## 2021-01-06 MED ORDER — ACYCLOVIR 5 % EX OINT: 1.0000 "application " | TOPICAL_OINTMENT | CUTANEOUS | 1 refills | Status: DC

## 2021-01-06 NOTE — Telephone Encounter (Signed)
Dr.Lavoie wrote on a piece of paper okay to send in acyclovir ointment.  Left message on patient voicemail Rx sent.

## 2021-01-18 ENCOUNTER — Telehealth: Payer: Self-pay | Admitting: *Deleted

## 2021-01-18 NOTE — Telephone Encounter (Signed)
Call to patient.  Notice received from [facility].  PA for Valacyclovir 5% ointment not covered by plan.  Patient has been notified by [facility] and already picked up Rx using GoodRx coupon provided by pharmacy.  Patient thankful for call.   Encounter closed.

## 2021-01-21 ENCOUNTER — Other Ambulatory Visit: Payer: Self-pay

## 2021-01-21 ENCOUNTER — Ambulatory Visit (INDEPENDENT_AMBULATORY_CARE_PROVIDER_SITE_OTHER): Payer: Medicare PPO | Admitting: Obstetrics & Gynecology

## 2021-01-21 ENCOUNTER — Encounter: Payer: Self-pay | Admitting: Obstetrics & Gynecology

## 2021-01-21 VITALS — BP 128/86 | HR 80 | Resp 14 | Ht 65.25 in | Wt 176.0 lb

## 2021-01-21 DIAGNOSIS — C50919 Malignant neoplasm of unspecified site of unspecified female breast: Secondary | ICD-10-CM

## 2021-01-21 DIAGNOSIS — Z01419 Encounter for gynecological examination (general) (routine) without abnormal findings: Secondary | ICD-10-CM

## 2021-01-21 DIAGNOSIS — Z780 Asymptomatic menopausal state: Secondary | ICD-10-CM | POA: Diagnosis not present

## 2021-01-21 DIAGNOSIS — A6000 Herpesviral infection of urogenital system, unspecified: Secondary | ICD-10-CM | POA: Diagnosis not present

## 2021-01-21 MED ORDER — VALACYCLOVIR HCL 1 G PO TABS: 500.0000 mg | ORAL_TABLET | Freq: Every day | ORAL | 4 refills | Status: DC

## 2021-01-21 NOTE — Progress Notes (Signed)
Jasmine Payne [birth year] [id number]   History:    80 y.o. G2P2L2  RP:  Established patient presenting for annual gyn exam   HPI: Postmenopausal, well on no hormone replacement therapy.  No postmenopausal bleeding.  No pelvic pain.  Abstinent. Minimally Sxic Cystocele.  Doing Kegels.  Complains of recurrences of herpes type I perianally 3-4 times a year.  Would like to start on valacyclovir prophylaxis.  History of left breast cancer.  Screening mammogram February 2022 was negative.  Urine and bowel movements normal.  BMI 29.06.  Physically active.  Health labs with Dr Virgina Jock.  Colono 04/2020.  BD 07/2019 Osteopenia.  Past medical history,surgical history, family history and social history were all reviewed and documented in the EPIC chart.  Gynecologic History Patient's last menstrual period was 06/19/1992.  Obstetric History OB History  Gravida Para Term Preterm AB Living  '2 2       2  '$ SAB IAB Ectopic Multiple Live Births               # Outcome Date GA Lbr Len/2nd Weight Sex Delivery Anes PTL Lv  2 Para           1 Para              ROS: A ROS was performed and pertinent positives and negatives are included in the history.  GENERAL: No fevers or chills. HEENT: No change in vision, no earache, sore throat or sinus congestion. NECK: No pain or stiffness. CARDIOVASCULAR: No chest pain or pressure. No palpitations. PULMONARY: No shortness of breath, cough or wheeze. GASTROINTESTINAL: No abdominal pain, nausea, vomiting or diarrhea, melena or bright red blood per rectum. GENITOURINARY: No urinary frequency, urgency, hesitancy or dysuria. MUSCULOSKELETAL: No joint or muscle pain, no back pain, no recent trauma. DERMATOLOGIC: No rash, no itching, no lesions. ENDOCRINE: No polyuria, polydipsia, no heat or cold intolerance. No recent change in weight. HEMATOLOGICAL: No anemia or easy bruising or bleeding. NEUROLOGIC: No headache, seizures, numbness, tingling or weakness. PSYCHIATRIC: No  depression, no loss of interest in normal activity or change in sleep pattern.     Exam:   BP 128/86 (BP Location: Right Arm, Patient Position: Sitting, Cuff Size: Normal)   Pulse 80   Resp 14   Ht 5' 5.25" (1.657 m)   Wt 176 lb (79.8 kg)   LMP 06/19/1992   BMI 29.06 kg/m   Body mass index is 29.06 kg/m.  General appearance : Well developed well nourished female. No acute distress HEENT: Eyes: no retinal hemorrhage or exudates,  Neck supple, trachea midline, no carotid bruits, no thyroidmegaly Lungs: Clear to auscultation, no rhonchi or wheezes, or rib retractions  Heart: Regular rate and rhythm, no murmurs or gallops Breast:Examined in sitting and supine position were symmetrical in appearance, no palpable masses or tenderness,  no skin retraction, no nipple inversion, no nipple discharge, no skin discoloration, no axillary or supraclavicular lymphadenopathy Abdomen: no palpable masses or tenderness, no rebound or guarding Extremities: no edema or skin discoloration or tenderness  Pelvic: Vulva: Normal             Vagina: No gross lesions or discharge  Cervix: No gross lesions or discharge  Uterus  AV, normal size, shape and consistency, non-tender and mobile  Adnexa  Without masses or tenderness  Anus: Normal.  No active HSV lesion currently   Assessment/Plan:  80 y.o. female for annual exam   1. Well woman exam with routine gynecological exam  Gynecologic exam in menopause with a small grade 2/4 cystocele which is minimally symptomatic.  Patient will continue Kegel exercises.  No indication to repeat a Pap test at this time.  Breast exam normal.  Screening mammogram February 2022 was negative.  Colonoscopy November 2021.  Health labs with Dr. Virgina Jock.  Body mass index stable at 29.06.  2. Postmenopause Well on no hormone replacement therapy.  No postmenopausal bleeding.  Osteopenia on bone density February 2021.  Will repeat bone density next year.  3. Recurrent genital  herpes simplex type 1 infection Having 3-4 recurrences of herpes type I perianally in the last year.  Decision to start on valacyclovir prophylaxis 500 mg daily per mouth.  Usage reviewed and prescription sent to pharmacy.  4. Malignant neoplasm of female breast, unspecified estrogen receptor status, unspecified laterality, unspecified site of breast ([place]) Normal breast exam today.  Screening mammogram Neg February 2022.  Other orders - valACYclovir (VALTREX) 1000 MG tablet; Take 0.5 tablets (500 mg total) by mouth daily. Prophylaxis   Princess Bruins MD, 9:09 AM 01/21/2021

## 2021-01-24 ENCOUNTER — Telehealth: Payer: Self-pay

## 2021-01-24 NOTE — Telephone Encounter (Signed)
Patient called asking if you could remove the information from her last visit about HSV.  She said she is terribly embarrassed that you put it in her chart and now all her other providers can see that and will know.    I explained it was part of her health history and you prescribed for it.  She said she would like to keep that between you and her and our office and not have it in her Epic Chart.  I told her I would pass on her concerns to her physician and I will call her with your reply.

## 2021-01-27 ENCOUNTER — Ambulatory Visit: Payer: Medicare PPO

## 2021-01-27 DIAGNOSIS — E785 Hyperlipidemia, unspecified: Secondary | ICD-10-CM | POA: Diagnosis not present

## 2021-01-31 NOTE — Telephone Encounter (Signed)
Jasmine Bruins, MD  Ramond Craver, RMA 3 days ago   Yes, it's hard to completely removed.  Would she like my notes to be blocked to other providers (like the psychiatrists do?   Spoke with patient. Advised as seen above. Patient request Dr. Dellis Filbert block all of her GYN notes from 01/21/21 going forward.   Advised I will forward request to Dr. Dellis Filbert. Patient verbalizes understanding and thankful for f/u.   Dr. Dellis Filbert -please review.

## 2021-02-03 DIAGNOSIS — Z0000 Encounter for general adult medical examination without abnormal findings: Secondary | ICD-10-CM | POA: Diagnosis not present

## 2021-02-03 DIAGNOSIS — Z1331 Encounter for screening for depression: Secondary | ICD-10-CM | POA: Diagnosis not present

## 2021-02-03 DIAGNOSIS — Z1212 Encounter for screening for malignant neoplasm of rectum: Secondary | ICD-10-CM | POA: Diagnosis not present

## 2021-02-03 DIAGNOSIS — R82998 Other abnormal findings in urine: Secondary | ICD-10-CM | POA: Diagnosis not present

## 2021-02-03 DIAGNOSIS — M8580 Other specified disorders of bone density and structure, unspecified site: Secondary | ICD-10-CM | POA: Diagnosis not present

## 2021-02-03 DIAGNOSIS — C4491 Basal cell carcinoma of skin, unspecified: Secondary | ICD-10-CM | POA: Diagnosis not present

## 2021-02-03 DIAGNOSIS — E785 Hyperlipidemia, unspecified: Secondary | ICD-10-CM | POA: Diagnosis not present

## 2021-02-03 DIAGNOSIS — Z853 Personal history of malignant neoplasm of breast: Secondary | ICD-10-CM | POA: Diagnosis not present

## 2021-02-03 DIAGNOSIS — K635 Polyp of colon: Secondary | ICD-10-CM | POA: Diagnosis not present

## 2021-02-03 DIAGNOSIS — M792 Neuralgia and neuritis, unspecified: Secondary | ICD-10-CM | POA: Diagnosis not present

## 2021-02-03 DIAGNOSIS — Z1339 Encounter for screening examination for other mental health and behavioral disorders: Secondary | ICD-10-CM | POA: Diagnosis not present

## 2021-02-03 DIAGNOSIS — N318 Other neuromuscular dysfunction of bladder: Secondary | ICD-10-CM | POA: Diagnosis not present

## 2021-02-03 DIAGNOSIS — D72819 Decreased white blood cell count, unspecified: Secondary | ICD-10-CM | POA: Diagnosis not present

## 2021-02-04 ENCOUNTER — Other Ambulatory Visit: Payer: Self-pay | Admitting: Internal Medicine

## 2021-02-04 DIAGNOSIS — N2889 Other specified disorders of kidney and ureter: Secondary | ICD-10-CM

## 2021-02-09 ENCOUNTER — Ambulatory Visit
Admission: RE | Admit: 2021-02-09 | Discharge: 2021-02-09 | Disposition: A | Discharge status: Home / Self Care | Payer: Medicare PPO | Source: Ambulatory Visit | Attending: Internal Medicine | Admitting: Internal Medicine

## 2021-02-09 DIAGNOSIS — K7689 Other specified diseases of liver: Secondary | ICD-10-CM | POA: Diagnosis not present

## 2021-02-09 DIAGNOSIS — N2889 Other specified disorders of kidney and ureter: Secondary | ICD-10-CM

## 2021-04-08 DIAGNOSIS — D1771 Benign lipomatous neoplasm of kidney: Secondary | ICD-10-CM | POA: Diagnosis not present

## 2021-04-08 DIAGNOSIS — R8271 Bacteriuria: Secondary | ICD-10-CM | POA: Diagnosis not present

## 2021-04-25 DIAGNOSIS — C642 Malignant neoplasm of left kidney, except renal pelvis: Secondary | ICD-10-CM | POA: Diagnosis not present

## 2021-04-25 DIAGNOSIS — K7689 Other specified diseases of liver: Secondary | ICD-10-CM | POA: Diagnosis not present

## 2021-04-25 DIAGNOSIS — D3002 Benign neoplasm of left kidney: Secondary | ICD-10-CM | POA: Diagnosis not present

## 2021-04-25 DIAGNOSIS — D1771 Benign lipomatous neoplasm of kidney: Secondary | ICD-10-CM | POA: Diagnosis not present

## 2021-05-23 DIAGNOSIS — M25562 Pain in left knee: Secondary | ICD-10-CM | POA: Diagnosis not present

## 2021-05-31 DIAGNOSIS — M25562 Pain in left knee: Secondary | ICD-10-CM | POA: Diagnosis not present

## 2021-06-06 DIAGNOSIS — D1771 Benign lipomatous neoplasm of kidney: Secondary | ICD-10-CM | POA: Diagnosis not present

## 2021-06-08 ENCOUNTER — Other Ambulatory Visit: Payer: Self-pay | Admitting: Urology

## 2021-06-08 DIAGNOSIS — D1771 Benign lipomatous neoplasm of kidney: Secondary | ICD-10-CM

## 2021-06-09 DIAGNOSIS — M25562 Pain in left knee: Secondary | ICD-10-CM | POA: Diagnosis not present

## 2021-06-16 DIAGNOSIS — M25562 Pain in left knee: Secondary | ICD-10-CM | POA: Diagnosis not present

## 2021-06-23 DIAGNOSIS — M25562 Pain in left knee: Secondary | ICD-10-CM | POA: Diagnosis not present

## 2021-06-29 DIAGNOSIS — M25562 Pain in left knee: Secondary | ICD-10-CM | POA: Diagnosis not present

## 2021-06-30 DIAGNOSIS — M25562 Pain in left knee: Secondary | ICD-10-CM | POA: Diagnosis not present

## 2021-07-05 ENCOUNTER — Encounter: Payer: Self-pay | Admitting: *Deleted

## 2021-07-05 ENCOUNTER — Ambulatory Visit
Admission: RE | Admit: 2021-07-05 | Discharge: 2021-07-05 | Disposition: A | Discharge status: Home / Self Care | Payer: Medicare PPO | Source: Ambulatory Visit | Attending: Urology | Admitting: Urology

## 2021-07-05 DIAGNOSIS — D1771 Benign lipomatous neoplasm of kidney: Secondary | ICD-10-CM

## 2021-07-05 DIAGNOSIS — M25562 Pain in left knee: Secondary | ICD-10-CM | POA: Diagnosis not present

## 2021-07-05 HISTORY — PX: IR RADIOLOGIST EVAL & MGMT: IMG5224

## 2021-07-05 NOTE — Consult Note (Signed)
Chief Complaint: Patient was seen in consultation today for a left renal angiomyolipoma at the request of Pace,Maryellen D  Referring Physician(s): Pace,Maryellen D  History of Present Illness: Jasmine Payne is a 81 y.o. female with a history of a known left upper pole renal angiomyolipoma since 2002 at which time CT demonstrated a roughly 2 cm angiomyolipoma of the upper pole with an exophytic component and no surrounding hemorrhage.  She did have a single episode of gross hematuria at that time and has not had any other episodes of hematuria.  CT of the abdomen on 08/23/2005 demonstrated axial dimensions of approximately 2.2 x 2.8 cm.  Most recent CT is dated 04/25/2021 and based on my measurements the lesion measures approximately 4.6 x 3.5 x 3.1 cm on that study.  CT demonstrates predominantly fat density with serpiginous vascularity in the lesion and no focal aneurysms.  The lesion is predominantly exophytic and emanates from the anterior upper pole cortex.  The lesion has also been followed by ultrasound where it appears as a vaguely marginated hyperechoic lesion which is more difficult to accurately measure and has increased from roughly 3.9 cm in maximum diameter in 2014 to roughly 5 cm in maximum diameter and 2022.  Jasmine Payne denies any pain related to the mass, hematuria or other urinary symptoms.  She does have some known enlarging benign hepatic cysts and has noticed a sensation of fullness in the epigastric region at times when reading, which correlates to the location of a 8 cm anterior subcapsular cyst in the left lobe. Jasmine Payne lives alone and is independent in activities of daily living. She is very active, but has been more limited recently by osteoarthritis of the left knee for which she is undergoing physical therapy.  Past Medical History:  Diagnosis Date   Allergy    Arthritis    mild    Breast cancer ([place]) 1994   left breast, lumpectomy/negative nodes/tamoxifen x 5  years   Endometrial polyp 3/02   Hx of adenomatous colonic polyps 05/12/2014   Hyperlipidemia    slight- on statin three x a week    Hypertension    no meds., pt denies   Insomnia    Kidney cysts 2003   benign, fatty cyst/angiomyolipoma   OAB (overactive bladder)    Osteopenia    Palpitations 07/01/2015   Personal history of radiation therapy    Vulvar abscess 10/97    Past Surgical History:  Procedure Laterality Date   BREAST BIOPSY     right breast-benign growth   BREAST LUMPECTOMY  1994   left side,radiation also   CATARACT EXTRACTION, BILATERAL  2018   COLONOSCOPY     hysteroscopic resection  4/02   IR RADIOLOGIST EVAL & MGMT  07/05/2021   POLYPECTOMY     TONSILLECTOMY AND ADENOIDECTOMY      Allergies: Codeine  Medications: Prior to Admission medications   Medication Sig Start Date End Date Taking? Authorizing Provider  acyclovir ointment (ZOVIRAX) 5 % Apply 1 application topically every 3 (three) hours. 01/06/21   Princess Bruins, MD  cholecalciferol (VITAMIN D3) 25 MCG (1000 UNIT) tablet Take 1,000 Units by mouth daily.    [provider]  Coenzyme Q10 (CO Q 10) 100 MG CAPS Take by mouth.    [provider]  gabapentin (NEURONTIN) 100 MG capsule as needed. 03/16/18   [provider]  Multiple Vitamins-Minerals (VISION-VITE PRESERVE PO) Take by mouth.    [provider]  RESTASIS 0.05 % ophthalmic emulsion  07/05/19   [provider]  rosuvastatin (CRESTOR) 5 MG tablet TAKE 1 TABLET BY MOUTH 3 TIMES A WEEK. NEEDS OFFICE VISIT 03/02/17   Skeet Latch, MD  valACYclovir (VALTREX) 1000 MG tablet Take 0.5 tablets (500 mg total) by mouth daily. Prophylaxis 01/21/21   Princess Bruins, MD  zolpidem (AMBIEN) 5 MG tablet TAKE 1/2 TO 1 TABLET BY MOUTH AT BEDTIME AS NEEDED FOR INSOMNIA 03/01/20   Megan Salon, MD     Family History  Problem Relation Age of Onset   Esophageal cancer Father    Colon cancer Mother 27   Colon  polyps Mother    Heart attack Sister    Colon cancer Paternal Grandfather    Irritable bowel syndrome Maternal Grandmother    Ovarian cancer Maternal Aunt    Breast cancer Daughter    Rectal cancer Neg Hx    Stomach cancer Neg Hx     Social History   Socioeconomic History   Marital status: Divorced    Spouse name: Not on file   Number of children: Not on file   Years of education: Not on file   Highest education level: Not on file  Occupational History   Not on file  Tobacco Use   Smoking status: Former   Smokeless tobacco: Never  Vaping Use   Vaping Use: Never used  Substance and Sexual Activity   Alcohol use: No   Drug use: No   Sexual activity: Not Currently    Partners: Male  Other Topics Concern   Not on file  Social History Narrative   Not on file   Social Determinants of Health   Financial Resource Strain: Not on file  Food Insecurity: Not on file  Transportation Needs: Not on file  Physical Activity: Not on file  Stress: Not on file  Social Connections: Not on file    Review of Systems: A 12 point ROS discussed and pertinent positives are indicated in the HPI above.  All other systems are negative.  Review of Systems  Constitutional: Negative.   Respiratory: Negative.    Cardiovascular: Negative.   Gastrointestinal: Negative.   Genitourinary: Negative.   Musculoskeletal: Negative.   Neurological: Negative.    Vital Signs: BP 136/72 (BP Location: Right Arm)    Pulse 80    LMP 06/19/1992    SpO2 93%   Physical Exam Vitals reviewed.  Constitutional:      General: She is not in acute distress.    Appearance: Normal appearance. She is not ill-appearing, toxic-appearing or diaphoretic.  Cardiovascular:     Rate and Rhythm: Normal rate and regular rhythm.     Heart sounds: Normal heart sounds. No murmur heard.   No friction rub. No gallop.  Pulmonary:     Effort: Pulmonary effort is normal. No respiratory distress.     Breath sounds: Normal  breath sounds. No stridor. No wheezing, rhonchi or rales.  Abdominal:     General: Abdomen is flat. Bowel sounds are normal. There is no distension.     Palpations: Abdomen is soft. There is no mass.     Tenderness: There is no abdominal tenderness. There is no right CVA tenderness, left CVA tenderness, guarding or rebound.  Musculoskeletal:        General: No swelling.  Skin:    General: Skin is warm and dry.  Neurological:     General: No focal deficit present.  Mental Status: She is alert and oriented to person, place, and time.    Imaging: See above.  Labs:  BMP: 04/25/21: BUN 12, Cr 0.7, EGFR 82 mL/min   Assessment and Plan:  I met with Jasmine Payne and we reviewed imaging studies and discussed the natural history of renal angiomyolipomas and potential treatment options.  Her left renal angiomyolipoma has clearly demonstrated slow growth since initial detection in 2002.  CT demonstrates small serpiginous vessels traversing the AML without focal aneurysms that would be an increased risk factor for spontaneous hemorrhage.  She also is not anticoagulated and not hypertensive which would be additional risk factors.  Her current risk factor for future risk of spontaneous hemorrhage is growth of the lesion to greater than 4 cm diameter.  I told her that it is difficult to estimate exact risk of spontaneous hemorrhage over time.  There are clearly studies demonstrating benefit in prophylactic embolization of angiomyolipomas greater than 4-5 cm in diameter.  I discussed the potential for future embolization with Jasmine Payne including technical details and risks.  The procedure involves conscious sedation, arteriography to define blood supply to the kidney and AML and selective microcatheter particle embolization of arterial blood supply to the AML.  This typically results in at least 50% volume reduction in size of the AML, significant reduction in tumor vascularity and therefore reduction in  spontaneous bleeding risk in the future.  Although embolization can be performed in the setting of an acute hemorrhage, I told Jasmine Payne that this would not be optimal and she would likely be hospitalized with an acute bleed.  I did also discuss the possibility of at least one interval follow-up imaging study to evaluate for growth and any change in tumor vascularity by CT.  Jasmine Payne is not particularly anxious about the lesion and is interested in having another follow-up CT with discussion at that time about potentially scheduling prophylactic embolization.  I recommended that we schedule a follow-up CT multiphase CT of the abdomen later this year, roughly in the summer months.  I will meet with her following that CT to review findings with her.  Thank you for this interesting consult.  I greatly enjoyed meeting Jasmine Payne and look forward to participating in their care.  A copy of this report was sent to the requesting provider on this date.  Electronically Signed: Azzie Roup 07/05/2021, 11:00 AM    I spent a total of 40 Minutes in face to face in clinical consultation, greater than 50% of which was counseling/coordinating care for a left renal angiomyolipoma.

## 2021-07-07 DIAGNOSIS — M25562 Pain in left knee: Secondary | ICD-10-CM | POA: Diagnosis not present

## 2021-07-11 DIAGNOSIS — M25562 Pain in left knee: Secondary | ICD-10-CM | POA: Diagnosis not present

## 2021-07-13 DIAGNOSIS — M25562 Pain in left knee: Secondary | ICD-10-CM | POA: Diagnosis not present

## 2021-07-14 DIAGNOSIS — M25562 Pain in left knee: Secondary | ICD-10-CM | POA: Diagnosis not present

## 2021-07-18 DIAGNOSIS — M25562 Pain in left knee: Secondary | ICD-10-CM | POA: Diagnosis not present

## 2021-08-09 DIAGNOSIS — M25562 Pain in left knee: Secondary | ICD-10-CM | POA: Diagnosis not present

## 2021-08-10 DIAGNOSIS — R8271 Bacteriuria: Secondary | ICD-10-CM | POA: Diagnosis not present

## 2021-08-10 DIAGNOSIS — N3000 Acute cystitis without hematuria: Secondary | ICD-10-CM | POA: Diagnosis not present

## 2021-08-19 DIAGNOSIS — N3000 Acute cystitis without hematuria: Secondary | ICD-10-CM | POA: Diagnosis not present

## 2021-10-03 ENCOUNTER — Other Ambulatory Visit: Payer: Self-pay | Admitting: Obstetrics & Gynecology

## 2021-10-03 ENCOUNTER — Other Ambulatory Visit: Payer: Self-pay | Admitting: Internal Medicine

## 2021-10-03 DIAGNOSIS — Z1231 Encounter for screening mammogram for malignant neoplasm of breast: Secondary | ICD-10-CM

## 2021-10-04 DIAGNOSIS — Z6827 Body mass index (BMI) 27.0-27.9, adult: Secondary | ICD-10-CM | POA: Diagnosis not present

## 2021-10-04 DIAGNOSIS — Z20822 Contact with and (suspected) exposure to covid-19: Secondary | ICD-10-CM | POA: Diagnosis not present

## 2021-10-04 DIAGNOSIS — R110 Nausea: Secondary | ICD-10-CM | POA: Diagnosis not present

## 2021-10-04 DIAGNOSIS — R093 Abnormal sputum: Secondary | ICD-10-CM | POA: Diagnosis not present

## 2021-10-04 DIAGNOSIS — B349 Viral infection, unspecified: Secondary | ICD-10-CM | POA: Diagnosis not present

## 2021-10-04 DIAGNOSIS — J029 Acute pharyngitis, unspecified: Secondary | ICD-10-CM | POA: Diagnosis not present

## 2021-10-04 DIAGNOSIS — R053 Chronic cough: Secondary | ICD-10-CM | POA: Diagnosis not present

## 2021-10-04 DIAGNOSIS — R030 Elevated blood-pressure reading, without diagnosis of hypertension: Secondary | ICD-10-CM | POA: Diagnosis not present

## 2021-10-04 DIAGNOSIS — J22 Unspecified acute lower respiratory infection: Secondary | ICD-10-CM | POA: Diagnosis not present

## 2021-12-02 ENCOUNTER — Other Ambulatory Visit: Payer: Self-pay | Admitting: Interventional Radiology

## 2021-12-02 DIAGNOSIS — N2889 Other specified disorders of kidney and ureter: Secondary | ICD-10-CM

## 2021-12-05 ENCOUNTER — Ambulatory Visit
Admission: RE | Admit: 2021-12-05 | Discharge: 2021-12-05 | Disposition: A | Discharge status: Home / Self Care | Payer: Medicare PPO | Source: Ambulatory Visit | Attending: Internal Medicine | Admitting: Internal Medicine

## 2021-12-05 DIAGNOSIS — Z1231 Encounter for screening mammogram for malignant neoplasm of breast: Secondary | ICD-10-CM

## 2021-12-22 DIAGNOSIS — D1771 Benign lipomatous neoplasm of kidney: Secondary | ICD-10-CM | POA: Diagnosis not present

## 2021-12-22 DIAGNOSIS — Z85528 Personal history of other malignant neoplasm of kidney: Secondary | ICD-10-CM | POA: Diagnosis not present

## 2021-12-27 ENCOUNTER — Ambulatory Visit (HOSPITAL_COMMUNITY)
Admission: RE | Admit: 2021-12-27 | Discharge: 2021-12-27 | Disposition: A | Discharge status: Home / Self Care | Payer: Medicare PPO | Source: Ambulatory Visit | Attending: Interventional Radiology | Admitting: Interventional Radiology

## 2021-12-27 ENCOUNTER — Encounter (HOSPITAL_COMMUNITY): Payer: Self-pay

## 2021-12-27 DIAGNOSIS — K7689 Other specified diseases of liver: Secondary | ICD-10-CM | POA: Diagnosis not present

## 2021-12-27 DIAGNOSIS — N2889 Other specified disorders of kidney and ureter: Secondary | ICD-10-CM | POA: Insufficient documentation

## 2021-12-27 DIAGNOSIS — N281 Cyst of kidney, acquired: Secondary | ICD-10-CM | POA: Diagnosis not present

## 2021-12-27 DIAGNOSIS — D1771 Benign lipomatous neoplasm of kidney: Secondary | ICD-10-CM | POA: Diagnosis not present

## 2021-12-27 DIAGNOSIS — Z85528 Personal history of other malignant neoplasm of kidney: Secondary | ICD-10-CM | POA: Diagnosis not present

## 2021-12-27 LAB — POCT I-STAT CREATININE: Creatinine, Ser: 0.7 mg/dL (ref 0.44–1.00)

## 2021-12-27 MED ORDER — SODIUM CHLORIDE (PF) 0.9 % IJ SOLN
INTRAMUSCULAR | Status: AC
  Filled 2021-12-27: qty 50

## 2021-12-27 MED ORDER — IOHEXOL 300 MG/ML  SOLN
100.0000 mL | Freq: Once | INTRAMUSCULAR | Status: AC | PRN
  Administered 2021-12-27: 100 mL via INTRAVENOUS

## 2022-01-02 ENCOUNTER — Ambulatory Visit (HOSPITAL_BASED_OUTPATIENT_CLINIC_OR_DEPARTMENT_OTHER): Payer: Medicare PPO | Admitting: Obstetrics & Gynecology

## 2022-01-02 ENCOUNTER — Encounter (HOSPITAL_BASED_OUTPATIENT_CLINIC_OR_DEPARTMENT_OTHER): Payer: Self-pay | Admitting: Obstetrics & Gynecology

## 2022-01-02 VITALS — BP 117/76 | HR 75 | Ht 65.5 in | Wt 168.2 lb

## 2022-01-02 DIAGNOSIS — Z4689 Encounter for fitting and adjustment of other specified devices: Secondary | ICD-10-CM

## 2022-01-02 DIAGNOSIS — A6004 Herpesviral vulvovaginitis: Secondary | ICD-10-CM

## 2022-01-02 DIAGNOSIS — N8110 Cystocele, unspecified: Secondary | ICD-10-CM

## 2022-01-02 DIAGNOSIS — K5900 Constipation, unspecified: Secondary | ICD-10-CM | POA: Diagnosis not present

## 2022-01-02 MED ORDER — VALACYCLOVIR HCL 500 MG PO TABS: 500.0000 mg | ORAL_TABLET | Freq: Every day | ORAL | 2 refills | Status: DC

## 2022-01-02 NOTE — Progress Notes (Signed)
GYNECOLOGY  VISIT  CC:   feeling a bulge and constipation  HPI: 81 y.o. G2P2 Divorced White or Caucasian female here for complaint of vaginal bulge that has worsened over the last six month.  She feels this is contributing to worsening constipation.  Has been using some Miralax.but two doses are what she needs for help with constipation.  Denies changes with urinary habits.  No significant incontinence.  Denies vaginal bleeding.  Feels like there is something in the vagina all the time that is uncomfortable.  Unrelated, pt does have hx of vulvar HSV and needs Valtrex refill.  Has no issues with Valtrex use in the past.  Past Medical History:  Diagnosis Date   Allergy    Arthritis    mild    Breast cancer ([place]) 1994   left breast, lumpectomy/negative nodes/tamoxifen x 5 years   Endometrial polyp 3/02   Hx of adenomatous colonic polyps 05/12/2014   Hyperlipidemia    slight- on statin three x a week    Hypertension    no meds., pt denies   Insomnia    Kidney cysts 2003   benign, fatty cyst/angiomyolipoma   OAB (overactive bladder)    Osteopenia    Palpitations 07/01/2015   Personal history of radiation therapy    Vulvar abscess 10/97    MEDS:   Current Outpatient Medications on File Prior to Visit  Medication Sig Dispense Refill   acyclovir ointment (ZOVIRAX) 5 % Apply 1 application topically every 3 (three) hours. 15 g 1   gabapentin (NEURONTIN) 100 MG capsule as needed.     RESTASIS 0.05 % ophthalmic emulsion      rosuvastatin (CRESTOR) 5 MG tablet TAKE 1 TABLET BY MOUTH 3 TIMES A WEEK. NEEDS OFFICE VISIT 12 tablet 0   valACYclovir (VALTREX) 1000 MG tablet Take 0.5 tablets (500 mg total) by mouth daily. Prophylaxis 45 tablet 4   No current facility-administered medications on file prior to visit.    ALLERGIES: Codeine  SH:  divorced, non smoker  Review of Systems  Gastrointestinal:  Positive for constipation.  Genitourinary: Negative.     PHYSICAL EXAMINATION:     BP 117/76 (BP Location: Right Arm, Patient Position: Sitting, Cuff Size: Large)   Pulse 75   Ht 5' 5.5" (1.664 m) Comment: reported  Wt 168 lb 3.2 oz (76.3 kg)   LMP 06/19/1992   BMI 27.56 kg/m     General appearance: alert, cooperative and appears stated age Lymph:  no inguinal LAD noted  Pelvic: External genitalia:  no lesions              Urethra:  normal appearing urethra with no masses, tenderness or lesions              Bartholins and Skenes: normal                 Vagina: normal appearing vagina with normal color and discharge, 3rd degree cystocele.  With Valsalva, this is 4th degree and past the introitus              Cervix: no lesions              Bimanual Exam:  Uterus:  normal size, contour, position, consistency, mobility, non-tender              Adnexa: no mass, fullness, tenderness             Presence of cystocele discussed.  Pelvic PT, surgery, pessary use all discussed.  Pt would like to try a pessary.  Fitting done today.  Initially, #4 incontinence ring with support placed without difficulty.  Pt tolerated this well but pessary was too large.  Then # 3 incontinence ring with support placed.  Much better fit present.  Pt could ambulate and void without difficulty.  Could not expel pessary with Valsalva.  Fitting pessary removed and Milex #3 placed.  Chaperone, Ezekiel Ina, RN, was present for exam.  Assessment/Plan: 1. Female cystocele  2. Encounter for fitting and adjustment of pessary - pt will return in 1 month for recheck.  Will try to teach removal and replacement at that time.  3. Constipation, unspecified constipation type  4. Type 2 HSV infection of vulvovaginal region - valACYclovir (VALTREX) 500 MG tablet; Take 1 tablet (500 mg total) by mouth daily.  Dispense: 30 tablet; Refill: 2

## 2022-01-03 ENCOUNTER — Encounter: Payer: Self-pay | Admitting: *Deleted

## 2022-01-03 ENCOUNTER — Ambulatory Visit
Admission: RE | Admit: 2022-01-03 | Discharge: 2022-01-03 | Disposition: A | Discharge status: Home / Self Care | Payer: Medicare PPO | Source: Ambulatory Visit | Attending: Interventional Radiology | Admitting: Interventional Radiology

## 2022-01-03 DIAGNOSIS — N2889 Other specified disorders of kidney and ureter: Secondary | ICD-10-CM

## 2022-01-03 DIAGNOSIS — D1771 Benign lipomatous neoplasm of kidney: Secondary | ICD-10-CM | POA: Diagnosis not present

## 2022-01-03 HISTORY — PX: IR RADIOLOGIST EVAL & MGMT: IMG5224

## 2022-01-03 NOTE — Progress Notes (Signed)
Chief Complaint: Patient was consulted remotely today (TeleHealth) for follow-up of a left renal angiomyolipoma.   History of Present Illness: Jasmine Payne is a 81 y.o. female with a history of a known left upper pole renal angiomyolipoma since 2002.  After previous consultation in January, Jasmine Payne elected to have a follow-up CT performed this summer and is here for follow-up after the CT was performed on 12/27/2021.  She remains asymptomatic and has not had any left-sided abdominal pain or hematuria.  She remains healthy and active.  She did see Dr. Sabra Heck yesterday for evaluation of vaginal bulging.  Past Medical History:  Diagnosis Date   Allergy    Arthritis    mild    Breast cancer ([place]) 1994   left breast, lumpectomy/negative nodes/tamoxifen x 5 years   Endometrial polyp 3/02   Hx of adenomatous colonic polyps 05/12/2014   Hyperlipidemia    slight- on statin three x a week    Hypertension    no meds., pt denies   Insomnia    Kidney cysts 2003   benign, fatty cyst/angiomyolipoma   OAB (overactive bladder)    Osteopenia    Palpitations 07/01/2015   Personal history of radiation therapy    Vulvar abscess 10/97    Past Surgical History:  Procedure Laterality Date   BREAST BIOPSY     right breast-benign growth   BREAST LUMPECTOMY  1994   left side,radiation also   CATARACT EXTRACTION, BILATERAL  2018   COLONOSCOPY     hysteroscopic resection  4/02   IR RADIOLOGIST EVAL & MGMT  07/05/2021   POLYPECTOMY     TONSILLECTOMY AND ADENOIDECTOMY      Allergies: Codeine  Medications: Prior to Admission medications   Medication Sig Start Date End Date Taking? Authorizing Provider  acyclovir ointment (ZOVIRAX) 5 % Apply 1 application topically every 3 (three) hours. 01/06/21   Princess Bruins, MD  gabapentin (NEURONTIN) 100 MG capsule as needed. 03/16/18   [provider]  RESTASIS 0.05 % ophthalmic emulsion  07/05/19   [provider]   rosuvastatin (CRESTOR) 5 MG tablet TAKE 1 TABLET BY MOUTH 3 TIMES A WEEK. NEEDS OFFICE VISIT 03/02/17   Skeet Latch, MD  valACYclovir (VALTREX) 500 MG tablet Take 1 tablet (500 mg total) by mouth daily. 01/02/22   Megan Salon, MD     Family History  Problem Relation Age of Onset   Esophageal cancer Father    Colon cancer Mother 48   Colon polyps Mother    Heart attack Sister    Colon cancer Paternal Grandfather    Irritable bowel syndrome Maternal Grandmother    Ovarian cancer Maternal Aunt    Breast cancer Daughter    Rectal cancer Neg Hx    Stomach cancer Neg Hx     Social History   Socioeconomic History   Marital status: Divorced    Spouse name: Not on file   Number of children: Not on file   Years of education: Not on file   Highest education level: Not on file  Occupational History   Not on file  Tobacco Use   Smoking status: Former   Smokeless tobacco: Never  Vaping Use   Vaping Use: Never used  Substance and Sexual Activity   Alcohol use: No   Drug use: No   Sexual activity: Not Currently    Partners: Male  Other Topics Concern   Not on file  Social History Narrative   Not  on file   Social Determinants of Health   Financial Resource Strain: Not on file  Food Insecurity: Not on file  Transportation Needs: Not on file  Physical Activity: Not on file  Stress: Not on file  Social Connections: Not on file    Review of Systems  Constitutional: Negative.   Respiratory: Negative.    Cardiovascular: Negative.   Gastrointestinal: Negative.   Genitourinary:        Vaginal bulging  Musculoskeletal: Negative.   Neurological: Negative.     Review of Systems: A 12 point ROS discussed and pertinent positives are indicated in the HPI above.  All other systems are negative.   Physical Exam No direct physical exam was performed (except for noted visual exam findings with Video Visits).   Vital Signs: LMP 06/19/1992   Imaging: CT ABDOMEN W WO  CONTRAST  Result Date: 12/28/2021 CLINICAL DATA:  History of LEFT renal mass follow-up, also history of breast cancer. EXAM: CT ABDOMEN WITHOUT AND WITH CONTRAST TECHNIQUE: Multidetector CT imaging of the abdomen was performed following the standard protocol before and following the bolus administration of intravenous contrast. RADIATION DOSE REDUCTION: This exam was performed according to the departmental dose-optimization program which includes automated exposure control, adjustment of the mA and/or kV according to patient size and/or use of iterative reconstruction technique. CONTRAST:  156m OMNIPAQUE IOHEXOL 300 MG/ML  SOLN COMPARISON:  Imaging from April 25, 2021. FINDINGS: Lower chest: Basilar atelectasis. No effusion or consolidative changes. Hepatobiliary: Hepatic cysts largest in the LEFT hepatic lobe up to 7.8 cm. Similar to prior imaging. Greater than 10 hepatic cysts. Portal vein is patent. No pericholecystic stranding. Mild prominence of the biliary tree is similar to previous imaging. No intrahepatic biliary duct distension. Pancreas: Normal, without mass, inflammation or ductal dilatation. Spleen: Lobular splenic contours with normal splenic size and without suspicious splenic lesion. Adrenals/Urinary Tract: Adrenal glands are normal. LEFT kidney: Arising from the anterior upper pole the LEFT kidney is a 4.6 x 3.3 cm fat containing lesion which is within 1 mm in the axial plane of its previous measurement. When measured in the coronal plane single greatest dimension of 5.1 cm is stable with similar 3.9 cm greatest craniocaudal dimension. Vessels can be seen traversing this large renal angiomyolipoma. This feature is also not substantially changed compared to previous imaging. Cyst in the interpolar LEFT kidney for which no dedicated imaging follow-up is recommended. No suspicious renal lesion in the RIGHT kidney. No hydronephrosis. No perinephric stranding. Stomach/Bowel: No acute  gastrointestinal process to the extent evaluated on this abdominal CT. Pelvic bowel loops are not imaged. Vascular/Lymphatic: Engorgement of LEFT ovarian vein. Aortic atherosclerosis. No adenopathy in the abdomen. Other: No abdominal wall hernia or abnormality. Musculoskeletal: No acute bone finding. No destructive bone process. Spinal degenerative changes. IMPRESSION: 1. Stable large LEFT renal angiomyolipoma. Measuring approximately 5.1 cm in single greatest dimension and greater than 4 cm in the axial plane as discussed within 1 mm of previous measurements. Internal vascularity is also similar. 2. Hepatic cysts. 3. Engorgement of LEFT ovarian vein. This can be seen in the setting of pelvic congestion syndrome. 4. Aortic atherosclerosis. Aortic Atherosclerosis (ICD10-I70.0). Electronically Signed   By: GZetta BillsM.D.   On: 12/28/2021 13:19   MM 3D SCREEN BREAST BILATERAL  Result Date: 12/06/2021 CLINICAL DATA:  Screening. EXAM: DIGITAL SCREENING BILATERAL MAMMOGRAM WITH TOMOSYNTHESIS AND CAD TECHNIQUE: Bilateral screening digital craniocaudal and mediolateral oblique mammograms were obtained. Bilateral screening digital breast tomosynthesis was performed. The images  were evaluated with computer-aided detection. COMPARISON:  Previous exam(s). ACR Breast Density Category b: There are scattered areas of fibroglandular density. FINDINGS: There are no findings suspicious for malignancy. IMPRESSION: No mammographic evidence of malignancy. A result letter of this screening mammogram will be mailed directly to the patient. RECOMMENDATION: Screening mammogram in one year. (Code:[id number]-B-01Y) BI-RADS CATEGORY  1: Negative. Electronically Signed   By: Kristopher Oppenheim M.D.   On: 12/06/2021 10:57    Labs:  CBC: No results for input(s): "WBC", "HGB", "HCT", "PLT" in the last 8760 hours.  COAGS: No results for input(s): "INR", "APTT" in the last 8760 hours.  BMP: Recent Labs    12/27/21 1043  CREATININE 0.70      Assessment and Plan:  I spoke with Jasmine Payne over the phone.  We reviewed the follow-up CT dated 12/27/2021.  This demonstrates stable size, morphology and enhancement of a left upper pole renal angiomyolipoma measuring approximately 4.6 cm in greatest axial transverse diameter.  Degree of vascularity of the angiomyolipoma remained stable and relatively low with no evidence of aneurysms.  Some incidental findings that are more evident on the current study include reflux into the left ovarian vein and findings suggestive of some relative compression of the left renal vein by the proximal trunk of the superior mesenteric artery suggestive of nutcracker physiology.  This appears to be clinically insignificant but may account for the episode of gross hematuria that she experienced back in 2002.  She has not had any hematuria since that time.  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  This also appears clinically insignificant as she has no underlying hypertension and has not had to ever take any antihypertensive medication.  Renal function has also been completely normal.  We again discussed potential prophylactic particle embolization of arterial supply to the left upper pole angiomyolipoma to prevent possible future spontaneous hemorrhage.  Although the lesion meets size criteria for embolization, it is relatively low in vascularity and does not have any concerning vascular features that may increase bleeding risk such as focal aneurysms.  There has been no growth in the AML since a prior scan on 04/25/2021.  Jasmine Payne is not very concerned about the lesion currently and would like to perform at least one additional follow-up CT in 1 year.  We will set up a multiphase CT of the abdomen for next July.  I asked her to contact me should she experience any new symptoms of left-sided abdominal or flank pain  in the interval or hematuria.   Electronically Signed: Azzie Roup 01/03/2022, 8:24 AM    I spent a total of  15 Minutes in remote  clinical consultation, greater than 50% of which was counseling/coordinating care for a left renal angiomyolipoma.    Visit type: Audio only (telephone). Audio (no video) only due to patient's lack of internet/smartphone capability. Alternative for in-person consultation at [facility], [address] [place], [place], [place]. This visit type was conducted due to national recommendations for restrictions regarding the COVID-19 Pandemic (e.g. social distancing).  This format is felt to be most appropriate for this patient at this time.  All issues noted in this document were discussed and addressed.

## 2022-01-06 ENCOUNTER — Encounter (HOSPITAL_BASED_OUTPATIENT_CLINIC_OR_DEPARTMENT_OTHER): Payer: Self-pay | Admitting: Obstetrics & Gynecology

## 2022-01-06 DIAGNOSIS — H43822 Vitreomacular adhesion, left eye: Secondary | ICD-10-CM | POA: Diagnosis not present

## 2022-01-06 DIAGNOSIS — H26493 Other secondary cataract, bilateral: Secondary | ICD-10-CM | POA: Diagnosis not present

## 2022-01-06 DIAGNOSIS — H353131 Nonexudative age-related macular degeneration, bilateral, early dry stage: Secondary | ICD-10-CM | POA: Diagnosis not present

## 2022-01-06 DIAGNOSIS — H04123 Dry eye syndrome of bilateral lacrimal glands: Secondary | ICD-10-CM | POA: Diagnosis not present

## 2022-01-06 DIAGNOSIS — N8110 Cystocele, unspecified: Secondary | ICD-10-CM | POA: Insufficient documentation

## 2022-01-06 DIAGNOSIS — A6004 Herpesviral vulvovaginitis: Secondary | ICD-10-CM | POA: Insufficient documentation

## 2022-01-13 ENCOUNTER — Encounter (HOSPITAL_BASED_OUTPATIENT_CLINIC_OR_DEPARTMENT_OTHER): Payer: Self-pay | Admitting: Obstetrics & Gynecology

## 2022-01-13 ENCOUNTER — Ambulatory Visit (HOSPITAL_BASED_OUTPATIENT_CLINIC_OR_DEPARTMENT_OTHER): Payer: Medicare PPO | Admitting: Obstetrics & Gynecology

## 2022-01-13 VITALS — BP 130/62 | HR 71 | Ht 65.5 in | Wt 168.6 lb

## 2022-01-13 DIAGNOSIS — N814 Uterovaginal prolapse, unspecified: Secondary | ICD-10-CM

## 2022-01-13 NOTE — Progress Notes (Unsigned)
81 y.o. Divorced White female G2P2 here for follow up.  She reports pessary seems lower and lower until with bowel movement it came out.  She can replace pessary herself now.  Denies vaginal bleeding.  Pessary did help symptoms.  Here for additional fitting.  She is not sexually active.    Patient has been using following pessary style and size:  #4 ring with support..  Pt does want to know what surgery would include.  Feel she would likely need colpopexy so surgery discussed.  Pt please to know most urogyn surgeries are done laparoscopically or vaginally.  Wil place consult.  Allergies  Allergen Reactions   Codeine     REACTION: Nausea    ROS: Patient denies significant menstrual problems.  Exam:   BP 130/62 (BP Location: Right Arm, Patient Position: Sitting, Cuff Size: Large)   Pulse 71   Ht 5' 5.5" (1.664 m)   Wt 168 lb 9.6 oz (76.5 kg)   LMP 06/19/1992   BMI 27.63 kg/m   General appearance: alert, cooperative, and no distress Inguinal lymph nodes:  not enlarged  Pelvic: External genitalia:  no lesions              Urethra: normal appearing urethra with no masses, tenderness or lesions              Bartholins and Skenes: Bartholin's, Urethra, Skene's normal                 Vagina: normal appearing vagina with normal color and discharge, no lesions               #5 incontinence ring with support placed.  Pt more comfortable with this.  Fit was better as well.  Able to walk and void.  Only had fitting pessary so this was removed.  Her prior on was replaced.  Will need to order #5 sizing for pt.     Assessment/Plan: 1. Cystocele with prolapse - #5 incontinence ring will be ordered for pt.  She will be called when come in. - Ambulatory referral to Urogynecology  2. Uterine prolapse - Ambulatory referral to Urogynecology

## 2022-01-16 ENCOUNTER — Encounter: Payer: Self-pay | Admitting: Physician Assistant

## 2022-01-16 ENCOUNTER — Ambulatory Visit: Payer: Medicare PPO | Admitting: Physician Assistant

## 2022-01-16 ENCOUNTER — Other Ambulatory Visit (INDEPENDENT_AMBULATORY_CARE_PROVIDER_SITE_OTHER): Payer: Medicare PPO

## 2022-01-16 VITALS — BP 106/72 | HR 88

## 2022-01-16 DIAGNOSIS — Z86010 Personal history of colonic polyps: Secondary | ICD-10-CM

## 2022-01-16 DIAGNOSIS — K5900 Constipation, unspecified: Secondary | ICD-10-CM

## 2022-01-16 LAB — CBC WITH DIFFERENTIAL/PLATELET
Basophils Absolute: 0.1 10*3/uL (ref 0.0–0.1)
Basophils Relative: 1.1 % (ref 0.0–3.0)
Eosinophils Absolute: 0.2 10*3/uL (ref 0.0–0.7)
Eosinophils Relative: 2.6 % (ref 0.0–5.0)
HCT: 43.7 % (ref 36.0–46.0)
Hemoglobin: 14.8 g/dL (ref 12.0–15.0)
Lymphocytes Relative: 33.7 % (ref 12.0–46.0)
Lymphs Abs: 2.3 10*3/uL (ref 0.7–4.0)
MCHC: 33.8 g/dL (ref 30.0–36.0)
MCV: 88 fl (ref 78.0–100.0)
Monocytes Absolute: 0.6 10*3/uL (ref 0.1–1.0)
Monocytes Relative: 8.9 % (ref 3.0–12.0)
Neutro Abs: 3.6 10*3/uL (ref 1.4–7.7)
Neutrophils Relative %: 53.7 % (ref 43.0–77.0)
Platelets: 237 10*3/uL (ref 150.0–400.0)
RBC: 4.97 Mil/uL (ref 3.87–5.11)
RDW: 14.4 % (ref 11.5–15.5)
WBC: 6.8 10*3/uL (ref 4.0–10.5)

## 2022-01-16 LAB — TSH: TSH: 1.73 u[IU]/mL (ref 0.35–5.50)

## 2022-01-16 NOTE — Patient Instructions (Addendum)
If you are age 81 or older, your body mass index should be between 23-30. Your There is no height or weight on file to calculate BMI. If this is out of the aforementioned range listed, please consider follow up with your Primary Care Provider. ________________________________________________________  The [facility] providers would like to encourage you to use [facility] to communicate with providers for non-urgent requests or questions.  Due to long hold times on the telephone, sending your provider a message by [facility] may be a faster and more efficient way to get a response.  Please allow 48 business hours for a response.  Please remember that this is for non-urgent requests.  _______________________________________________________  Your provider has requested that you go to the basement level for lab work before leaving today. Press "B" on the elevator. The lab is located at the first door on the left as you exit the elevator.  START Miralax 1 capful in 8 ounces of water or juice daily.  Call the office and speak with Amy's nurse, Beth for any changes in your symptoms.  Thank you for entrusting me with your care and choosing [facility].  Amy Esterwood, PA-C

## 2022-01-16 NOTE — Progress Notes (Signed)
Subjective:    Patient ID: Jasmine Payne, female    DOB: [birth year], 81 y.o.   MRN: [id number]  HPI Jasmine Payne is a pleasant 81 year old white female, established with Dr. Carlean Purl.  She was last seen here in November 2021 when she underwent follow-up colonoscopy which was done per Dr. Fuller Plan (in Dr. Celesta Aver absence).  Patient has family history of colon cancer in her mother and personal history of adenomatous colon polyps.  She was found to have multiple sigmoid diverticuli, small internal hemorrhoids, and 5 polyps were removed, the largest was 15 mm. Path showed 4 of the polyps to be tubular adenomas, and the largest polyp was a sessile serrated polyp.  No recall planned due to age.  She comes in today with new complaint of constipation.  She says she has had some very minor issues with constipation in the past but nothing that required her to take laxatives etc.  She has been dealing with a prolapsed uterus over the past multiple months and has been told by gynecology that her constipation is probably being caused by the uterine prolapse.  She says when she sits to have a bowel movement and even strains mildly this will cause worsening of the prolapse.  She does have a pessary but says sometimes this comes out due to the significance of the prolapse.  She is scheduled to see a surgical gynecologist soon to discuss surgical correction. Over the past few months she has had episodes of significant constipation and says that she would go for days unable to have a bowel movement and have very hard stool.  She has been drinking a lot of water, eats a lot of fiber on a regular basis.  Did have COVID-19 in May 2023 and says the constipation was definitely worse at that time probably due to poor intake. MiraLAX had been suggested but she says that 1 capful per day does not seem to make much difference and she has recently tried 2 doses per day.  She had not been taking MiraLAX on a scheduled daily basis. She has  no complaints of abdominal discomfort, no melena or hematochezia.  Appetite has been fine. She is in generally good health, exercises on a daily basis with swimming and walking, does have prior history of breast cancer.  Review of Systems Pertinent positive and negative review of systems were noted in the above HPI section.  All other review of systems was otherwise negative.   Outpatient Encounter Medications as of 01/16/2022  Medication Sig   gabapentin (NEURONTIN) 100 MG capsule as needed.   RESTASIS 0.05 % ophthalmic emulsion    rosuvastatin (CRESTOR) 5 MG tablet TAKE 1 TABLET BY MOUTH 3 TIMES A WEEK. NEEDS OFFICE VISIT   acyclovir ointment (ZOVIRAX) 5 % Apply 1 application topically every 3 (three) hours. (Patient not taking: Reported on 01/16/2022)   valACYclovir (VALTREX) 500 MG tablet Take 1 tablet (500 mg total) by mouth daily.   No facility-administered encounter medications on file as of 01/16/2022.   Allergies  Allergen Reactions   Codeine     REACTION: Nausea   Patient Active Problem List   Diagnosis Date Noted   Female cystocele 01/06/2022   Type 2 HSV infection of vulvovaginal region 01/06/2022   Palpitations 07/01/2015   Hx of adenomatous colonic polyps 05/12/2014   Pure hypercholesterolemia 01/30/2012   Malignant neoplasm of female breast ([place]) 03/01/2009   Social History   Socioeconomic History   Marital status: Divorced  Spouse name: Not on file   Number of children: Not on file   Years of education: Not on file   Highest education level: Not on file  Occupational History   Not on file  Tobacco Use   Smoking status: Former   Smokeless tobacco: Never  Vaping Use   Vaping Use: Never used  Substance and Sexual Activity   Alcohol use: No   Drug use: No   Sexual activity: Not Currently    Partners: Male  Other Topics Concern   Not on file  Social History Narrative   Not on file   Social Determinants of Health   Financial Resource Strain: Not on  file  Food Insecurity: Not on file  Transportation Needs: Not on file  Physical Activity: Not on file  Stress: Not on file  Social Connections: Not on file  Intimate Partner Violence: Not on file    Jasmine Payne's family history includes Breast cancer in her daughter; Colon cancer in her paternal grandfather; Colon cancer (age of onset: 22) in her mother; Colon polyps in her mother; Esophageal cancer in her father; Heart attack in her sister; Irritable bowel syndrome in her maternal grandmother; Ovarian cancer in her maternal aunt.      Objective:    Vitals:   01/16/22 1449  BP: 106/72  Pulse: 88  SpO2: 96%    Physical Exam Well-developed well-nourished elderly white female in no acute distress.  Pleasant height, Weight, 168 BMI 27.6  HEENT; nontraumatic normocephalic, EOMI, PE R LA, sclera anicteric. Oropharynx; not examined today Neck; supple, no JVD Cardiovascular; regular rate and rhythm with S1-S2, no murmur rub or gallop Pulmonary; Clear bilaterally Abdomen; soft, nontender, nondistended, no palpable mass or hepatosplenomegaly, bowel sounds are active Rectal; external hemorrhoid, no significant stool in the rectal vault, heme-negative there is some bulging anteriorly into the rectum from pessary/vagina Skin; benign exam, no jaundice rash or appreciable lesions Extremities; no clubbing cyanosis or edema skin warm and dry Neuro/Psych; alert and oriented x4, grossly nonfocal mood and affect appropriate        Assessment & Plan:   #37 81 year old white female with new onset of constipation over the past 6 months. I suspect that the constipation is being exacerbated by uterine prolapse.  #2 family history of colon cancer in mother. #3.  Personal history of tubular adenomatous and sessile serrated polyp-last colonoscopy November 2021-4 polyps removed largest 15 mm which was a sessile serrated polyp.  No follow-up colonoscopy planned due to age  #4 personal history of breast  cancer #5 diverticulosis  Plan; patient will continue high-fiber diet and liberal water intake, daily exercise We discussed trial of MiraLAX 17 g on a daily basis and she can titrate dose as needed to achieve regular bowel movements without any significant straining.  She was concerned that MiraLAX was contraindicated on a regular basis, and reassured her that it was safe to take daily. I do not think she needs a bowel purge at present as she had a good bowel movement today and no significant stool involved.  She is encouraged to call back should she have any prolonged episodes of obstipation and we can instruct her in a bowel purge. We will check CBC today, and TSH. Discussed that follow-up colonoscopy could be done if indicated for significant change in bowel habits.  She will plan to follow-up with the surgical gynecologist, and work with the regular dosing of MiraLAX initially, if she has any worsening of symptoms can certainly  consider  colonoscopy Dr. Carlean Purl.  Tiyanna Larcom Genia Harold PA-C 01/16/2022   Cc: Shon Baton, MD

## 2022-01-23 ENCOUNTER — Other Ambulatory Visit: Payer: Self-pay | Admitting: Obstetrics & Gynecology

## 2022-01-23 DIAGNOSIS — N3000 Acute cystitis without hematuria: Secondary | ICD-10-CM | POA: Diagnosis not present

## 2022-01-23 NOTE — Telephone Encounter (Signed)
Last annual exam 01/2021 No future exam scheduled

## 2022-01-24 ENCOUNTER — Ambulatory Visit: Payer: Medicare PPO | Admitting: Obstetrics & Gynecology

## 2022-02-02 DIAGNOSIS — E785 Hyperlipidemia, unspecified: Secondary | ICD-10-CM | POA: Diagnosis not present

## 2022-02-02 DIAGNOSIS — M8580 Other specified disorders of bone density and structure, unspecified site: Secondary | ICD-10-CM | POA: Diagnosis not present

## 2022-02-02 DIAGNOSIS — N2889 Other specified disorders of kidney and ureter: Secondary | ICD-10-CM | POA: Diagnosis not present

## 2022-02-02 DIAGNOSIS — R7989 Other specified abnormal findings of blood chemistry: Secondary | ICD-10-CM | POA: Diagnosis not present

## 2022-02-02 DIAGNOSIS — D1771 Benign lipomatous neoplasm of kidney: Secondary | ICD-10-CM | POA: Diagnosis not present

## 2022-02-03 ENCOUNTER — Ambulatory Visit (HOSPITAL_BASED_OUTPATIENT_CLINIC_OR_DEPARTMENT_OTHER): Payer: Medicare PPO | Admitting: Obstetrics & Gynecology

## 2022-02-03 ENCOUNTER — Encounter (HOSPITAL_BASED_OUTPATIENT_CLINIC_OR_DEPARTMENT_OTHER): Payer: Self-pay | Admitting: Obstetrics & Gynecology

## 2022-02-03 VITALS — BP 144/80 | HR 72 | Wt 166.4 lb

## 2022-02-03 DIAGNOSIS — N8110 Cystocele, unspecified: Secondary | ICD-10-CM

## 2022-02-03 DIAGNOSIS — M545 Low back pain, unspecified: Secondary | ICD-10-CM | POA: Diagnosis not present

## 2022-02-03 DIAGNOSIS — Z4689 Encounter for fitting and adjustment of other specified devices: Secondary | ICD-10-CM

## 2022-02-03 NOTE — Progress Notes (Signed)
81 y.o. Divorced White female G2P2 here for pessary check.  She reports no complaints.  She is not sexually active.    Patient has been using following pessary style and size:  #4 incontinence ring with support, no knob.  Reports she has been having some lower back pain this past week.  Isn't sure if this is related to the pessary.  No dysuria.  Saw urology last week and was prescribed abx.  Felt this helped some.  No recent trauma.  Doesn't feel like this is improved if she leaves out her pessary which she can remove and replace fairly easily.  Denies vaginal bleeding.  Also has some questions about h/o HSV.  Wants to know type.  Was concerned would have new symptoms with colonoscopy.  Advised we could treat for suppressive therapy if ever has something like this in the future.  Questions answered.  Allergies  Allergen Reactions   Codeine     REACTION: Nausea    ROS: Low back pain present, mild  Exam:   BP (!) 144/80 (BP Location: Right Arm, Patient Position: Sitting, Cuff Size: Large)   Pulse 72   Wt 166 lb 6.4 oz (75.5 kg)   LMP 06/19/1992   BMI 27.27 kg/m   General appearance: alert and no distress Inguinal lymph nodes:  not enlarged  Pelvic: External genitalia:  no lesions              Urethra: normal appearing urethra with no masses, tenderness or lesions              Bartholins and Skenes: Bartholin's, Urethra, Skene's normal                 Vagina: normal appearing vagina with normal color and discharge, no lesions              Cervix: normal appearance   Pap obtained:  no Bimanual Exam:  Uterus:  uterus is normal size, shape, consistency and nontender                               Adnexa:    normal adnexa in size, nontender and no masses                               Anus:  normal sphincter tone, no lesions  Pessary was removed without difficulty.  Pessary was cleansed.  Pessary was replaced. Patient tolerated procedure well.    Assessment/Plan: 1. Pessary  maintenance - pt can removed and replace pessary.  Will do this every two weeks, at least, and leave out overnight.  2. Female cystocele  3. Acute midline low back pain without sciatica - Urine Culture - if negative culture and pain does not resolve, would recommend sports medicine consult.  Pt knows to call and I can place this for her.

## 2022-02-06 ENCOUNTER — Other Ambulatory Visit (HOSPITAL_BASED_OUTPATIENT_CLINIC_OR_DEPARTMENT_OTHER): Payer: Self-pay | Admitting: Obstetrics & Gynecology

## 2022-02-06 DIAGNOSIS — N3000 Acute cystitis without hematuria: Secondary | ICD-10-CM

## 2022-02-06 LAB — URINE CULTURE

## 2022-02-06 MED ORDER — AMOXICILLIN 500 MG PO CAPS: 500.0000 mg | ORAL_CAPSULE | Freq: Three times a day (TID) | ORAL | 0 refills | Status: AC

## 2022-02-06 MED ORDER — FLUCONAZOLE 150 MG PO TABS: 150.0000 mg | ORAL_TABLET | Freq: Once | ORAL | 0 refills | Status: AC

## 2022-02-07 NOTE — Progress Notes (Signed)
Pt had called inquiring on urine culture. Dr Sabra Heck called the patient and sent in a prescription for her. KW CMA

## 2022-02-13 ENCOUNTER — Encounter (HOSPITAL_BASED_OUTPATIENT_CLINIC_OR_DEPARTMENT_OTHER): Payer: Self-pay | Admitting: Obstetrics & Gynecology

## 2022-02-13 ENCOUNTER — Ambulatory Visit (INDEPENDENT_AMBULATORY_CARE_PROVIDER_SITE_OTHER): Payer: Medicare PPO | Admitting: Obstetrics & Gynecology

## 2022-02-13 VITALS — BP 126/75 | HR 74 | Wt 167.2 lb

## 2022-02-13 DIAGNOSIS — N3000 Acute cystitis without hematuria: Secondary | ICD-10-CM

## 2022-02-13 DIAGNOSIS — G4700 Insomnia, unspecified: Secondary | ICD-10-CM | POA: Diagnosis not present

## 2022-02-13 DIAGNOSIS — D1771 Benign lipomatous neoplasm of kidney: Secondary | ICD-10-CM | POA: Diagnosis not present

## 2022-02-13 DIAGNOSIS — I700 Atherosclerosis of aorta: Secondary | ICD-10-CM | POA: Diagnosis not present

## 2022-02-13 DIAGNOSIS — M545 Low back pain, unspecified: Secondary | ICD-10-CM | POA: Diagnosis not present

## 2022-02-13 DIAGNOSIS — M25569 Pain in unspecified knee: Secondary | ICD-10-CM | POA: Diagnosis not present

## 2022-02-13 DIAGNOSIS — Z1389 Encounter for screening for other disorder: Secondary | ICD-10-CM | POA: Diagnosis not present

## 2022-02-13 DIAGNOSIS — Z1331 Encounter for screening for depression: Secondary | ICD-10-CM | POA: Diagnosis not present

## 2022-02-13 DIAGNOSIS — N8110 Cystocele, unspecified: Secondary | ICD-10-CM | POA: Diagnosis not present

## 2022-02-13 DIAGNOSIS — M8580 Other specified disorders of bone density and structure, unspecified site: Secondary | ICD-10-CM | POA: Diagnosis not present

## 2022-02-13 DIAGNOSIS — E785 Hyperlipidemia, unspecified: Secondary | ICD-10-CM | POA: Diagnosis not present

## 2022-02-13 DIAGNOSIS — Z960 Presence of urogenital implants: Secondary | ICD-10-CM | POA: Diagnosis not present

## 2022-02-13 DIAGNOSIS — Z0000 Encounter for general adult medical examination without abnormal findings: Secondary | ICD-10-CM | POA: Diagnosis not present

## 2022-02-13 NOTE — Progress Notes (Signed)
GYNECOLOGY  VISIT  CC:   recheck pessary, low back pain  HPI: 81 y.o. G2P2 Divorced White or Caucasian female here for follow up after UTI treatment and follow up with pessary.  She is able to take her pessary in an out at night.  This doesn't seem to have helped low back pain that she is experiencing.  She did have culture positive UTI.  She finished her antibiotics on Saturday.  Will repeat the urine culture today.  Denies any urinary symptoms.  She really feel the pessary has helped and isn't sure if her low back pain is related.  She notices the pain more when she is driving and in her car.  Would like to consider seeing a sports medicine provider as well.   Past Medical History:  Diagnosis Date   Allergy    Arthritis    mild    Breast cancer ([place]) 1994   left breast, lumpectomy/negative nodes/tamoxifen x 5 years   Endometrial polyp 3/02   Hx of adenomatous colonic polyps 05/12/2014   Hyperlipidemia    slight- on statin three x a week    Hypertension    no meds., pt denies   Insomnia    Kidney cysts 2003   benign, fatty cyst/angiomyolipoma   OAB (overactive bladder)    Osteopenia    Palpitations 07/01/2015   Personal history of radiation therapy    Vulvar abscess 10/97    MEDS:   Current Outpatient Medications on File Prior to Visit  Medication Sig Dispense Refill   RESTASIS 0.05 % ophthalmic emulsion      rosuvastatin (CRESTOR) 5 MG tablet TAKE 1 TABLET BY MOUTH 3 TIMES A WEEK. NEEDS OFFICE VISIT 12 tablet 0   valACYclovir (VALTREX) 1000 MG tablet TAKE 0.5 TABLETS (500 MG TOTAL) BY MOUTH DAILY. PROPHYLAXIS 45 tablet 1   No current facility-administered medications on file prior to visit.    ALLERGIES: Codeine  SH:  divorced, non smoker  Review of Systems  Constitutional: Negative.   Genitourinary: Negative.     PHYSICAL EXAMINATION:    BP 126/75   Pulse 74   Wt 167 lb 3.2 oz (75.8 kg)   LMP 06/19/1992   BMI 27.40 kg/m     General appearance: alert,  cooperative and appears stated age No other exam performed today  Assessment/Plan: 1. Acute cystitis without hematuria - pt has finished antibiotics.  Will repeat urine culture. - Urine Culture  2. Acute left-sided low back pain without sciatica - discussed referral.  Pt would like me to place this today. - AMB referral to sports medicine  3. Female cystocele - does have appt now with Dr. Wannetta Sender for possible discussed of surgical options  4. Presence of pessary - pt is removing, cleaning and replacing.  Will continue with pessary use for now.

## 2022-02-13 NOTE — Patient Instructions (Signed)
[facility] at [facility] [place], [place] [address] [phone]

## 2022-02-14 DIAGNOSIS — H26492 Other secondary cataract, left eye: Secondary | ICD-10-CM | POA: Diagnosis not present

## 2022-02-14 LAB — URINE CULTURE: Organism ID, Bacteria: NO GROWTH

## 2022-02-14 NOTE — Progress Notes (Deleted)
[employer] [place] Phone: [phone] Subjective:    I'm seeing this patient by the request  of:  Shon Baton, MD  CC:   [id number]  Jasmine Payne is a 81 y.o. female coming in with complaint of LBP       Past Medical History:  Diagnosis Date   Allergy    Arthritis    mild    Breast cancer ([place]) 1994   left breast, lumpectomy/negative nodes/tamoxifen x 5 years   Endometrial polyp 3/02   Hx of adenomatous colonic polyps 05/12/2014   Hyperlipidemia    slight- on statin three x a week    Hypertension    no meds., pt denies   Insomnia    Kidney cysts 2003   benign, fatty cyst/angiomyolipoma   OAB (overactive bladder)    Osteopenia    Palpitations 07/01/2015   Personal history of radiation therapy    Vulvar abscess 10/97   Past Surgical History:  Procedure Laterality Date   BREAST BIOPSY     right breast-benign growth   BREAST LUMPECTOMY  1994   left side,radiation also   CATARACT EXTRACTION, BILATERAL  2018   COLONOSCOPY     HYSTEROSCOPY  09/2000   IR RADIOLOGIST EVAL & MGMT  07/05/2021   IR RADIOLOGIST EVAL & MGMT  01/03/2022   POLYPECTOMY     TONSILLECTOMY AND ADENOIDECTOMY     Social History   Socioeconomic History   Marital status: Divorced    Spouse name: Not on file   Number of children: Not on file   Years of education: Not on file   Highest education level: Not on file  Occupational History   Not on file  Tobacco Use   Smoking status: Former   Smokeless tobacco: Never  Vaping Use   Vaping Use: Never used  Substance and Sexual Activity   Alcohol use: No   Drug use: No   Sexual activity: Not Currently    Partners: Male  Other Topics Concern   Not on file  Social History Narrative   Not on file   Social Determinants of Health   Financial Resource Strain: Not on file  Food Insecurity: Not on file  Transportation Needs: Not on file  Physical Activity: Not on file   Stress: Not on file  Social Connections: Not on file   Allergies  Allergen Reactions   Codeine     REACTION: Nausea   Family History  Problem Relation Age of Onset   Esophageal cancer Father    Colon cancer Mother 60   Colon polyps Mother    Heart attack Sister    Colon cancer Paternal Grandfather    Irritable bowel syndrome Maternal Grandmother    Ovarian cancer Maternal Aunt    Breast cancer Daughter    Rectal cancer Neg Hx    Stomach cancer Neg Hx      Current Outpatient Medications (Cardiovascular):    rosuvastatin (CRESTOR) 5 MG tablet, TAKE 1 TABLET BY MOUTH 3 TIMES A WEEK. NEEDS OFFICE VISIT     Current Outpatient Medications (Other):    RESTASIS 0.05 % ophthalmic emulsion,    Reviewed prior external information including notes and imaging from  primary care provider As well as notes that were available from care everywhere and other healthcare systems.  Past medical history, social, surgical and family history all reviewed in electronic medical record.  No pertanent information unless stated regarding to the chief  complaint.   Review of Systems:  No headache, visual changes, nausea, vomiting, diarrhea, constipation, dizziness, abdominal pain, skin rash, fevers, chills, night sweats, weight loss, swollen lymph nodes, body aches, joint swelling, chest pain, shortness of breath, mood changes. POSITIVE muscle aches  Objective  Last menstrual period 06/19/1992.   General: No apparent distress alert and oriented x3 mood and affect normal, dressed appropriately.  HEENT: Pupils equal, extraocular movements intact  Respiratory: Patient's speak in full sentences and does not appear short of breath  Cardiovascular: No lower extremity edema, non tender, no erythema      Impression and Recommendations:

## 2022-02-15 ENCOUNTER — Ambulatory Visit: Payer: Medicare PPO | Admitting: Family Medicine

## 2022-03-13 DIAGNOSIS — D2272 Melanocytic nevi of left lower limb, including hip: Secondary | ICD-10-CM | POA: Diagnosis not present

## 2022-03-13 DIAGNOSIS — L814 Other melanin hyperpigmentation: Secondary | ICD-10-CM | POA: Diagnosis not present

## 2022-03-13 DIAGNOSIS — D1801 Hemangioma of skin and subcutaneous tissue: Secondary | ICD-10-CM | POA: Diagnosis not present

## 2022-03-13 DIAGNOSIS — D2239 Melanocytic nevi of other parts of face: Secondary | ICD-10-CM | POA: Diagnosis not present

## 2022-03-13 DIAGNOSIS — D2261 Melanocytic nevi of right upper limb, including shoulder: Secondary | ICD-10-CM | POA: Diagnosis not present

## 2022-03-13 DIAGNOSIS — L738 Other specified follicular disorders: Secondary | ICD-10-CM | POA: Diagnosis not present

## 2022-03-13 DIAGNOSIS — D225 Melanocytic nevi of trunk: Secondary | ICD-10-CM | POA: Diagnosis not present

## 2022-03-13 DIAGNOSIS — Z85828 Personal history of other malignant neoplasm of skin: Secondary | ICD-10-CM | POA: Diagnosis not present

## 2022-03-13 DIAGNOSIS — L821 Other seborrheic keratosis: Secondary | ICD-10-CM | POA: Diagnosis not present

## 2022-04-05 ENCOUNTER — Telehealth: Payer: Self-pay | Admitting: Physician Assistant

## 2022-04-05 NOTE — Telephone Encounter (Signed)
Patient called states Jasmine Payne asked her to call back if she still having same problem, states she thinks she needs a colonoscopy. Requesting a call back to discuss further. Please call to advise.

## 2022-04-05 NOTE — Telephone Encounter (Signed)
Spoke with the patient. She continues to take Miralax for constipation. She is compliant with fiber and fluid intake. Her GYN appointment for the uterine prolapse is in January. The patient calls today concerned about this ongoing problem. States she cannot get the thought of colon cancer out of her mind. She needs to know that this is not what is causing the difficultly emptying her bowels.

## 2022-04-10 NOTE — Telephone Encounter (Signed)
Called the patient to schedule her colonoscopy with Dr Carlean Purl and the pre-visit. She is at [employer] and in a store. She asks to call back and schedule herself at that time. Told her that would be fine.

## 2022-04-10 NOTE — Telephone Encounter (Signed)
OK to schedule colonoscopy for change in bowel habits

## 2022-04-20 ENCOUNTER — Encounter: Payer: Self-pay | Admitting: Internal Medicine

## 2022-04-20 DIAGNOSIS — M8589 Other specified disorders of bone density and structure, multiple sites: Secondary | ICD-10-CM | POA: Diagnosis not present

## 2022-04-20 NOTE — Telephone Encounter (Signed)
Patient has been scheduled for colonoscopy.

## 2022-04-21 ENCOUNTER — Other Ambulatory Visit (HOSPITAL_BASED_OUTPATIENT_CLINIC_OR_DEPARTMENT_OTHER): Payer: Self-pay | Admitting: Obstetrics & Gynecology

## 2022-04-21 DIAGNOSIS — A6004 Herpesviral vulvovaginitis: Secondary | ICD-10-CM

## 2022-04-26 ENCOUNTER — Encounter (HOSPITAL_BASED_OUTPATIENT_CLINIC_OR_DEPARTMENT_OTHER): Payer: Self-pay

## 2022-04-26 ENCOUNTER — Ambulatory Visit (HOSPITAL_BASED_OUTPATIENT_CLINIC_OR_DEPARTMENT_OTHER): Payer: Medicare PPO | Admitting: Obstetrics & Gynecology

## 2022-05-29 DIAGNOSIS — R82998 Other abnormal findings in urine: Secondary | ICD-10-CM | POA: Diagnosis not present

## 2022-06-02 ENCOUNTER — Other Ambulatory Visit: Payer: Self-pay | Admitting: Internal Medicine

## 2022-06-02 ENCOUNTER — Ambulatory Visit (AMBULATORY_SURGERY_CENTER): Payer: Medicare PPO

## 2022-06-02 VITALS — Ht 65.0 in | Wt 172.0 lb

## 2022-06-02 DIAGNOSIS — Z86010 Personal history of colonic polyps: Secondary | ICD-10-CM

## 2022-06-02 DIAGNOSIS — Z800 Family history of malignant neoplasm of digestive organs: Secondary | ICD-10-CM

## 2022-06-02 MED ORDER — NA SULFATE-K SULFATE-MG SULF 17.5-3.13-1.6 GM/177ML PO SOLN: 1.0000 | Freq: Once | ORAL | 0 refills | Status: AC

## 2022-06-02 NOTE — Progress Notes (Signed)
No egg or soy allergy known to patient   No issues known to pt with past sedation with any surgeries or procedures  Patient denies ever being told they had issues or difficulty with intubation   No FH of Malignant Hyperthermia  Pt is not on diet pills  Pt is not on  home 02   Pt is not on blood thinners   Pt HAS issues with constipation  PT TO TAKE EXTRA MIRALAX ONCE OR TWICE DAILY FOR 7 DAYS BEFORE THE COLONOSCOPY  No A fib or A flutter  Have any cardiac testing pending--NO   Pt instructed to use Single[employer].com or GoodRx for a price reduction on prep

## 2022-06-07 ENCOUNTER — Other Ambulatory Visit: Payer: Self-pay | Admitting: Obstetrics & Gynecology

## 2022-06-07 ENCOUNTER — Telehealth: Payer: Self-pay | Admitting: Internal Medicine

## 2022-06-07 NOTE — Telephone Encounter (Signed)
Called Patient back with No Answer at 9:33.  Left message to call back.

## 2022-06-07 NOTE — Telephone Encounter (Signed)
Print prep instructions and suprep coupon and leave at front desk

## 2022-06-07 NOTE — Telephone Encounter (Signed)
Patient called would like a call back from a nurse to discuss prep medication issue.

## 2022-06-07 NOTE — Telephone Encounter (Signed)
Patient is calling states she spoke with pharmacy her insurance doesn't cover Rx but wants the nurses prescribe and would like to know where to find the coupon. Please advise

## 2022-06-27 ENCOUNTER — Encounter: Payer: Self-pay | Admitting: Internal Medicine

## 2022-06-29 ENCOUNTER — Ambulatory Visit (AMBULATORY_SURGERY_CENTER): Payer: Medicare PPO | Admitting: Internal Medicine

## 2022-06-29 ENCOUNTER — Encounter: Payer: Self-pay | Admitting: Internal Medicine

## 2022-06-29 ENCOUNTER — Encounter: Payer: Medicare PPO | Admitting: Internal Medicine

## 2022-06-29 VITALS — BP 115/53 | HR 67 | Temp 98.0°F | Resp 17 | Ht 65.0 in | Wt 172.0 lb

## 2022-06-29 DIAGNOSIS — K649 Unspecified hemorrhoids: Secondary | ICD-10-CM | POA: Diagnosis not present

## 2022-06-29 DIAGNOSIS — R194 Change in bowel habit: Secondary | ICD-10-CM | POA: Diagnosis not present

## 2022-06-29 DIAGNOSIS — K5730 Diverticulosis of large intestine without perforation or abscess without bleeding: Secondary | ICD-10-CM

## 2022-06-29 DIAGNOSIS — Z86010 Personal history of colonic polyps: Secondary | ICD-10-CM

## 2022-06-29 MED ORDER — SODIUM CHLORIDE 0.9 % IV SOLN: 500.0000 mL | Freq: Once | INTRAVENOUS | Status: DC

## 2022-06-29 NOTE — Progress Notes (Signed)
Pt's states no medical or surgical changes since previsit or office visit. 

## 2022-06-29 NOTE — Patient Instructions (Addendum)
No polyps or cancer seen.  I think the constipation is from the pelvic organ prolapse.  I appreciate the opportunity to care for you. Gatha Mayer, MD, FACG   YOU HAD AN ENDOSCOPIC PROCEDURE TODAY AT [facility]:   Refer to the procedure report that was given to you for any specific questions about what was found during the examination.  If the procedure report does not answer your questions, please call your gastroenterologist to clarify.  If you requested that your care partner not be given the details of your procedure findings, then the procedure report has been included in a sealed envelope for you to review at your convenience later.  YOU SHOULD EXPECT: Some feelings of bloating in the abdomen. Passage of more gas than usual.  Walking can help get rid of the air that was put into your GI tract during the procedure and reduce the bloating. If you had a lower endoscopy (such as a colonoscopy or flexible sigmoidoscopy) you may notice spotting of blood in your stool or on the toilet paper. If you underwent a bowel prep for your procedure, you may not have a normal bowel movement for a few days.  Please Note:  You might notice some irritation and congestion in your nose or some drainage.  This is from the oxygen used during your procedure.  There is no need for concern and it should clear up in a day or so.  SYMPTOMS TO REPORT IMMEDIATELY:  Following lower endoscopy (colonoscopy or flexible sigmoidoscopy):  Excessive amounts of blood in the stool  Significant tenderness or worsening of abdominal pains  Swelling of the abdomen that is new, acute  Fever of 100F or higher  For urgent or emergent issues, a gastroenterologist can be reached at any hour by calling [phone]. Do not use MyChart messaging for urgent concerns.    DIET:  We do recommend a small meal at first, but then you may proceed to your regular diet.  Drink plenty of fluids but you should avoid  alcoholic beverages for 24 hours.  ACTIVITY:  You should plan to take it easy for the rest of today and you should NOT DRIVE or use heavy machinery until tomorrow (because of the sedation medicines used during the test).    FOLLOW UP: Our staff will call the number listed on your records the next business day following your procedure.  We will call around 7:15- 8:00 am to check on you and address any questions or concerns that you may have regarding the information given to you following your procedure. If we do not reach you, we will leave a message.     SIGNATURES/CONFIDENTIALITY: You and/or your care partner have signed paperwork which will be entered into your electronic medical record.  These signatures attest to the fact that that the information above on your After Visit Summary has been reviewed and is understood.  Full responsibility of the confidentiality of this discharge information lies with you and/or your care-partner.

## 2022-06-29 NOTE — Progress Notes (Signed)
To pacu, VSS. Report to rn.tb °

## 2022-06-29 NOTE — Op Note (Signed)
[facility] Patient Name: Jasmine Payne Procedure Date: 06/29/2022 1:18 PM MRN: [id number] Endoscopist: Gatha Mayer , MD, [id number] Age: 82 Referring MD:  Date of Birth: [birth year] Gender: Female Account #: [id number] Procedure:                Colonoscopy Indications:              Change in bowel habits Medicines:                Monitored Anesthesia Care Procedure:                Pre-Anesthesia Assessment:                           - Prior to the procedure, a History and Physical                            was performed, and patient medications and                            allergies were reviewed. The patient's tolerance of                            previous anesthesia was also reviewed. The risks                            and benefits of the procedure and the sedation                            options and risks were discussed with the patient.                            All questions were answered, and informed consent                            was obtained. Prior Anticoagulants: The patient has                            taken no anticoagulant or antiplatelet agents. ASA                            Grade Assessment: II - A patient with mild systemic                            disease. After reviewing the risks and benefits,                            the patient was deemed in satisfactory condition to                            undergo the procedure.                           After obtaining informed consent, the colonoscope  was passed under direct vision. Throughout the                            procedure, the patient's blood pressure, pulse, and                            oxygen saturations were monitored continuously. The                            PCF-HQ190L Colonoscope was introduced through the                            anus and advanced to the the cecum, identified by                            appendiceal orifice and ileocecal  valve. The                            colonoscopy was somewhat difficult due to                            significant looping. Successful completion of the                            procedure was aided by straightening and shortening                            the scope to obtain bowel loop reduction and                            applying abdominal pressure. The patient tolerated                            the procedure well. The quality of the bowel                            preparation was excellent. The ileocecal valve,                            appendiceal orifice, and rectum were photographed.                            The bowel preparation used was SUPREP via split                            dose instruction. Scope In: 1:42:36 PM Scope Out: 2:01:32 PM Scope Withdrawal Time: 0 hours 7 minutes 4 seconds  Total Procedure Duration: 0 hours 18 minutes 56 seconds  Findings:                 Hemorrhoids were found on perianal exam.                           Multiple diverticula were found in the sigmoid  colon.                           External and internal hemorrhoids were found.                           The exam was otherwise without abnormality on                            direct and retroflexion views. Complications:            No immediate complications. Estimated Blood Loss:     Estimated blood loss: none. Impression:               - Hemorrhoids found on perianal exam.                           - Diverticulosis in the sigmoid colon.                           - External and internal hemorrhoids.                           - The examination was otherwise normal on direct                            and retroflexion views.                           - No specimens collected.                           - Personal history of colonic polyps and FHx CRCA                           - Constipation most likely from pelvic organ                             prolapse - contine MiraLax - itr is helping and she                            will see urogynecology Recommendation:           - Patient has a contact number available for                            emergencies. The signs and symptoms of potential                            delayed complications were discussed with the                            patient. Return to normal activities tomorrow.                            Written discharge instructions were provided to the  patient.                           - Continue present medications.                           - No repeat colonoscopy due to age. Gatha Mayer, MD 06/29/2022 2:13:10 PM This report has been signed electronically.

## 2022-06-29 NOTE — Progress Notes (Signed)
[facility] History and Physical   Primary Care Physician:  Shon Baton, MD   Reason for Procedure:   Change in bowel habits  Plan:    colonoscopy     HPI: Jasmine Payne is a 82 y.o. female w/ change in bowel habits (constipation problems) and family history of colon cancer in mother.   Personal history of tubular adenomatous and sessile serrated polyp-last colonoscopy November 2021-4 polyps removed largest 15 mm which was a sessile serrated polyp    Also has uterine prolapse problems. Past Medical History:  Diagnosis Date   Allergy    Arthritis    mild    Breast cancer ([place]) 1994   left breast, lumpectomy/negative nodes/tamoxifen x 5 years   Endometrial polyp 08/2000   Hx of adenomatous colonic polyps 05/12/2014   Hyperlipidemia    slight- on statin three x a week    Insomnia    Kidney cysts 2003   benign, fatty cyst/angiomyolipoma   OAB (overactive bladder)    Osteopenia    Palpitations 07/01/2015   Personal history of radiation therapy    Vulvar abscess 03/1996    Past Surgical History:  Procedure Laterality Date   BREAST BIOPSY     right breast-benign growth   BREAST LUMPECTOMY  1994   left side,radiation also   CATARACT EXTRACTION, BILATERAL  2018   COLONOSCOPY     HYSTEROSCOPY  09/2000   IR RADIOLOGIST EVAL & MGMT  07/05/2021   IR RADIOLOGIST EVAL & MGMT  01/03/2022   POLYPECTOMY     TONSILLECTOMY AND ADENOIDECTOMY      Prior to Admission medications   Medication Sig Start Date End Date Taking? Authorizing Provider  celecoxib (CELEBREX) 50 MG capsule Take 50 mg by mouth as needed for pain.   Yes [provider]  gabapentin (NEURONTIN) 100 MG capsule Take 100 mg by mouth 3 (three) times daily.   Yes [provider]  RESTASIS 0.05 % ophthalmic emulsion  07/05/19  Yes [provider]  rosuvastatin (CRESTOR) 5 MG tablet TAKE 1 TABLET BY MOUTH 3 TIMES A WEEK. NEEDS OFFICE VISIT 03/02/17  Yes Skeet Latch, MD     Current Outpatient Medications  Medication Sig Dispense Refill   celecoxib (CELEBREX) 50 MG capsule Take 50 mg by mouth as needed for pain.     gabapentin (NEURONTIN) 100 MG capsule Take 100 mg by mouth 3 (three) times daily.     RESTASIS 0.05 % ophthalmic emulsion      rosuvastatin (CRESTOR) 5 MG tablet TAKE 1 TABLET BY MOUTH 3 TIMES A WEEK. NEEDS OFFICE VISIT 12 tablet 0   Current Facility-Administered Medications  Medication Dose Route Frequency Provider Last Rate Last Admin   0.9 %  sodium chloride infusion  500 mL Intravenous Once Gatha Mayer, MD        Allergies as of 06/29/2022 - Review Complete 06/29/2022  Allergen Reaction Noted   Codeine      Family History  Problem Relation Age of Onset   Colon cancer Mother 57   Colon polyps Mother    Esophageal cancer Father    Heart attack Sister    Ovarian cancer Maternal Aunt    Irritable bowel syndrome Maternal Grandmother    Colon cancer Maternal Grandfather    Breast cancer Daughter    Rectal cancer Neg Hx    Stomach cancer Neg Hx     Social History   Socioeconomic History   Marital status: Divorced    Spouse  name: Not on file   Number of children: Not on file   Years of education: Not on file   Highest education level: Not on file  Occupational History   Not on file  Tobacco Use   Smoking status: Former    Years: 20.00    Types: Cigarettes    Quit date: 95    Years since quitting: 42.0   Smokeless tobacco: Never  Vaping Use   Vaping Use: Never used  Substance and Sexual Activity   Alcohol use: Not Currently    Comment: not for many years   Drug use: Never   Sexual activity: Not Currently    Partners: Male    Birth control/protection: Post-menopausal  Other Topics Concern   Not on file  Social History Narrative   Not on file   Social Determinants of Health   Financial Resource Strain: Not on file  Food Insecurity: Not on file  Transportation Needs: Not on file  Physical Activity: Not on  file  Stress: Not on file  Social Connections: Not on file  Intimate Partner Violence: Not on file    Review of Systems:  All other review of systems negative except as mentioned in the HPI.  Physical Exam: Vital signs BP 127/76   Pulse 80   Temp 98 F (36.7 C)   Ht '5\' 5"'$  (1.651 m)   Wt 172 lb (78 kg)   LMP 06/19/1992   SpO2 97%   BMI 28.62 kg/m   General:   Alert,  Well-developed, well-nourished, pleasant and cooperative in NAD Lungs:  Clear throughout to auscultation.   Heart:  Regular rate and rhythm; no murmurs, clicks, rubs,  or gallops. Abdomen:  Soft, nontender and nondistended. Normal bowel sounds.   Neuro/Psych:  Alert and cooperative. Normal mood and affect. A and O x 3   '@Ximena Todaro'$  Simonne Maffucci, MD, [facility] [phone] (pager) 06/29/2022 1:32 PM@

## 2022-06-30 ENCOUNTER — Telehealth: Payer: Self-pay

## 2022-06-30 NOTE — Telephone Encounter (Signed)
Follow up call placed, VM obtained and message left. 

## 2022-07-11 ENCOUNTER — Encounter: Payer: Self-pay | Admitting: Obstetrics and Gynecology

## 2022-07-11 ENCOUNTER — Ambulatory Visit: Payer: Medicare PPO | Admitting: Obstetrics and Gynecology

## 2022-07-11 VITALS — BP 129/72 | HR 70 | Ht 64.5 in | Wt 176.0 lb

## 2022-07-11 DIAGNOSIS — N8110 Cystocele, unspecified: Secondary | ICD-10-CM | POA: Diagnosis not present

## 2022-07-11 DIAGNOSIS — N812 Incomplete uterovaginal prolapse: Secondary | ICD-10-CM | POA: Diagnosis not present

## 2022-07-11 DIAGNOSIS — R82998 Other abnormal findings in urine: Secondary | ICD-10-CM | POA: Diagnosis not present

## 2022-07-11 DIAGNOSIS — N3281 Overactive bladder: Secondary | ICD-10-CM

## 2022-07-11 DIAGNOSIS — N816 Rectocele: Secondary | ICD-10-CM

## 2022-07-11 DIAGNOSIS — R350 Frequency of micturition: Secondary | ICD-10-CM

## 2022-07-11 DIAGNOSIS — N952 Postmenopausal atrophic vaginitis: Secondary | ICD-10-CM | POA: Diagnosis not present

## 2022-07-11 LAB — POCT URINALYSIS DIPSTICK
Bilirubin, UA: NEGATIVE
Glucose, UA: NEGATIVE
Ketones, UA: NEGATIVE
Nitrite, UA: NEGATIVE
Protein, UA: NEGATIVE
Spec Grav, UA: 1.025 (ref 1.010–1.025)
Urobilinogen, UA: 0.2 E.U./dL
pH, UA: 6 (ref 5.0–8.0)

## 2022-07-11 MED ORDER — ESTRADIOL 0.1 MG/GM VA CREA: 0.5000 g | TOPICAL_CREAM | VAGINAL | 11 refills | Status: AC

## 2022-07-11 NOTE — Progress Notes (Signed)
[facility] New Patient Evaluation and Consultation  Referring Provider: Megan Salon, MD PCP: Shon Baton, MD Date of Service: 07/11/2022  SUBJECTIVE Chief Complaint: New Patient (Initial Visit) Jasmine Payne is a 82 y.o. female here for a consult on prolapse./)  History of Present Illness: [name] is a 82 y.o. White or Caucasian female seen in consultation at the request of Dr. Sabra Heck for evaluation of prolapse.    Review of records significant for: Currently using a ring pessary for her prolapse.   Urinary Symptoms: Leaks urine with with a full bladder. Denies SUI symptoms.  Leaks 3-4 time(s) per day. Has more urgency is related to coffee intake Pad use:  liners/ mini-pads  She is not bothered by her UI symptoms.  Day time voids 6.  Nocturia: 3-6 times per night to void (drinks a lot of water at night). Voiding dysfunction: she empties her bladder well.  does not use a catheter to empty bladder.  When urinating, she feels she has no difficulties Drinks: coffee, sips water all day long, drinks it at night as well.    UTIs: 1 UTI's in the last year.   Denies history of blood in urine and kidney or bladder stones  Pelvic Organ Prolapse Symptoms:                  She Admits to a feeling of a bulge the vaginal area. It has been present for 1 year.  She Admits to seeing a bulge.  This bulge is bothersome. Has a pessary in place- #4 ring, she is removing and cleaning herself.  She feels the pessary has been working well for her, has been supporting the prolapse well.   Bowel Symptom: Bowel movements: 1 time(s) per day when she takes miralax (most days)- constipation has improved with this Stool consistency: soft  Straining: yes.  Splinting: yes.  Incomplete evacuation: yes.  She Denies accidental bowel leakage / fecal incontinence Bowel regimen: fiber and miralax Last colonoscopy: Jan 2024  Sexual Function Sexually active: no.    Pelvic  Pain Denies pelvic pain   Past Medical History:  Past Medical History:  Diagnosis Date   Allergy    Arthritis    mild    Breast cancer ([place]) 1994   left breast, lumpectomy/negative nodes/tamoxifen x 5 years   Endometrial polyp 08/2000   Hx of adenomatous colonic polyps 05/12/2014   Hyperlipidemia    slight- on statin three x a week    Insomnia    Kidney cysts 2003   benign, fatty cyst/angiomyolipoma   OAB (overactive bladder)    Osteopenia    Palpitations 07/01/2015   Personal history of radiation therapy    Vulvar abscess 03/1996     Past Surgical History:   Past Surgical History:  Procedure Laterality Date   BREAST BIOPSY     right breast-benign growth   BREAST LUMPECTOMY  1994   left side,radiation also   CATARACT EXTRACTION, BILATERAL  2018   COLONOSCOPY     HYSTEROSCOPY  09/2000   IR RADIOLOGIST EVAL & MGMT  07/05/2021   IR RADIOLOGIST EVAL & MGMT  01/03/2022   POLYPECTOMY     TONSILLECTOMY AND ADENOIDECTOMY       Past OB/GYN History: OB History  Gravida Para Term Preterm AB Living  '2 2 2     2  '$ SAB IAB Ectopic Multiple Live Births          2    #  Outcome Date GA Lbr Len/2nd Weight Sex Delivery Anes PTL Lv  2 Term      Vag-Spont     1 Term      Vag-Forceps      Menopausal: Denies vaginal bleeding since menopause Any history of abnormal pap smears: no.   Medications: She has a current medication list which includes the following prescription(s): celecoxib, [START ON 07/13/2022] estradiol, gabapentin, restasis, and rosuvastatin.   Allergies: Patient is allergic to codeine.   Social History:  Social History   Tobacco Use   Smoking status: Former    Years: 20.00    Types: Cigarettes    Quit date: 1982    Years since quitting: 42.0   Smokeless tobacco: Never  Vaping Use   Vaping Use: Never used  Substance Use Topics   Alcohol use: Not Currently    Comment: not for many years   Drug use: Never   She lives alone She is not  employed. Regular exercise: Yes: swimming History of abuse: No  Family History:   Family History  Problem Relation Age of Onset   Colon cancer Mother 63   Colon polyps Mother    Esophageal cancer Father    Heart attack Sister    Ovarian cancer Maternal Aunt    Irritable bowel syndrome Maternal Grandmother    Colon cancer Maternal Grandfather    Breast cancer Daughter    Rectal cancer Neg Hx    Stomach cancer Neg Hx      Review of Systems: Review of Systems  Constitutional:  Negative for fever, malaise/fatigue and weight loss.  Respiratory:  Negative for cough, shortness of breath and wheezing.   Cardiovascular:  Negative for chest pain, palpitations and leg swelling.  Gastrointestinal:  Negative for abdominal pain and blood in stool.  Genitourinary:  Negative for dysuria.  Musculoskeletal:  Negative for myalgias.  Skin:  Negative for rash.  Neurological:  Positive for headaches. Negative for dizziness.  Endo/Heme/Allergies:  Does not bruise/bleed easily.  Psychiatric/Behavioral:  Negative for depression. The patient is not nervous/anxious.      OBJECTIVE Physical Exam: Vitals:   07/11/22 1413  BP: 129/72  Pulse: 70  Weight: 176 lb (79.8 kg)  Height: 5' 4.5" (1.638 m)    Physical Exam Constitutional:      General: She is not in acute distress. Pulmonary:     Effort: Pulmonary effort is normal.  Abdominal:     General: There is no distension.     Palpations: Abdomen is soft.     Tenderness: There is no abdominal tenderness. There is no rebound.  Musculoskeletal:        General: No swelling. Normal range of motion.  Skin:    General: Skin is warm and dry.     Findings: No rash.  Neurological:     Mental Status: She is alert and oriented to person, place, and time.     Coordination: Coordination normal.  Psychiatric:        Mood and Affect: Mood normal.        Behavior: Behavior normal.      GU / Detailed Urogynecologic Evaluation:  Pelvic Exam: Normal  external female genitalia; Bartholin's and Skene's glands normal in appearance; urethral meatus normal in appearance, no urethral masses or discharge. Tiny blood blister noted under urethra to the right- pt reports irritation here when removing pessary.   CST: negative  Pessary removed and cleaned, replaced at end of exam.  Speculum exam reveals normal vaginal mucosa  with atrophy. Cervix normal appearance. Uterus normal single, nontender. Adnexa no mass, fullness, tenderness.    Pelvic floor strength I/V  Pelvic floor musculature: Right levator non-tender, Right obturator non-tender, Left levator non-tender, Left obturator non-tender  POP-Q:   POP-Q  0                                            Aa   0                                           Ba  -4                                              C   4                                            Gh  4.5                                            Pb  8                                            tvl   0                                            Ap  0                                            Bp  -7                                              D      Rectal Exam:  Normal external rectum  Post-Void Residual (PVR) by Bladder Scan: In order to evaluate bladder emptying, we discussed obtaining a postvoid residual and she agreed to this procedure.  Procedure: The ultrasound unit was placed on the patient's abdomen in the suprapubic region after the patient had voided. A PVR of 12 ml was obtained by bladder scan.  Laboratory Results: POC urine: small leukocytes, trace blood   ASSESSMENT AND PLAN Ms. Mcconico is a 82 y.o. with:  1. Prolapse of anterior vaginal wall   2. Prolapse of posterior vaginal wall   3. Uterovaginal prolapse, incomplete   4. Overactive bladder   5. Urinary frequency   6. Vaginal atrophy   7. Leukocytes in urine    Stage II anterior, Stage II posterior,  Stage I apical prolapse - For treatment of  pelvic organ prolapse, we discussed options for management including expectant management, conservative management, and surgical management, such as Kegels, a pessary, pelvic floor physical therapy, and specific surgical procedures. - She is potentially interested in surgery. We discussed the option of [place] Colpocleisis, which would give the best success rate and be the least invasive option. She does not desire to be sexually active. Handout provided on this option and she is considering.   2. OAB - We discussed the symptoms of overactive bladder (OAB), which include urinary urgency, urinary frequency, nocturia, with or without urge incontinence.  While we do not know the exact etiology of OAB, several treatment options exist. We discussed management including behavioral therapy (decreasing bladder irritants, urge suppression strategies, timed voids, bladder retraining), physical therapy, medication. - Discussed decreasing caffeine intake and avoid drinking prior to bedtime and at night.  - She is interested in pelvic PT, referral placed. She states she will plan to do PT later in the year in the summer.  - Will need urodynamic testing if she wants to pursue surgery.   3. Vaginal atrophy - prescribed estrace cream 0.5g nightly for two weeks then twice a week after.   - Urine sent today due to leukocytes in urine  Return as needed   Jaquita Folds, MD

## 2022-07-11 NOTE — Patient Instructions (Signed)
We discussed the symptoms of overactive bladder (OAB), which include urinary urgency, urinary frequency, night-time urination, with or without urge incontinence.  We discussed management including behavioral therapy (decreasing bladder irritants by following a bladder diet, urge suppression strategies, timed voids, bladder retraining), physical therapy, medication.   You have a stage 2 (out of 4) prolapse.  We discussed the fact that it is not life threatening but there are several treatment options. For treatment of pelvic organ prolapse, we discussed options for management including expectant management, conservative management, and surgical management, such as Kegels, a pessary, pelvic floor physical therapy, and specific surgical procedures.

## 2022-07-12 ENCOUNTER — Encounter: Payer: Self-pay | Admitting: Obstetrics and Gynecology

## 2022-07-13 LAB — URINE CULTURE

## 2022-07-27 ENCOUNTER — Ambulatory Visit (HOSPITAL_BASED_OUTPATIENT_CLINIC_OR_DEPARTMENT_OTHER): Payer: Medicare PPO | Admitting: Obstetrics & Gynecology

## 2022-08-04 IMAGING — MG MM DIGITAL SCREENING BILAT W/ TOMO AND CAD
8 series · 8 of 24 positions shown · non-contrast
Comparison: Previous exam(s).

CLINICAL DATA: Screening.

EXAM:
DIGITAL SCREENING BILATERAL MAMMOGRAM WITH TOMOSYNTHESIS AND CAD
TECHNIQUE: Bilateral screening digital craniocaudal and mediolateral oblique
mammograms were obtained. Bilateral screening digital breast
tomosynthesis was performed. The images were evaluated with
computer-aided detection.

[R MLO synth-2D]
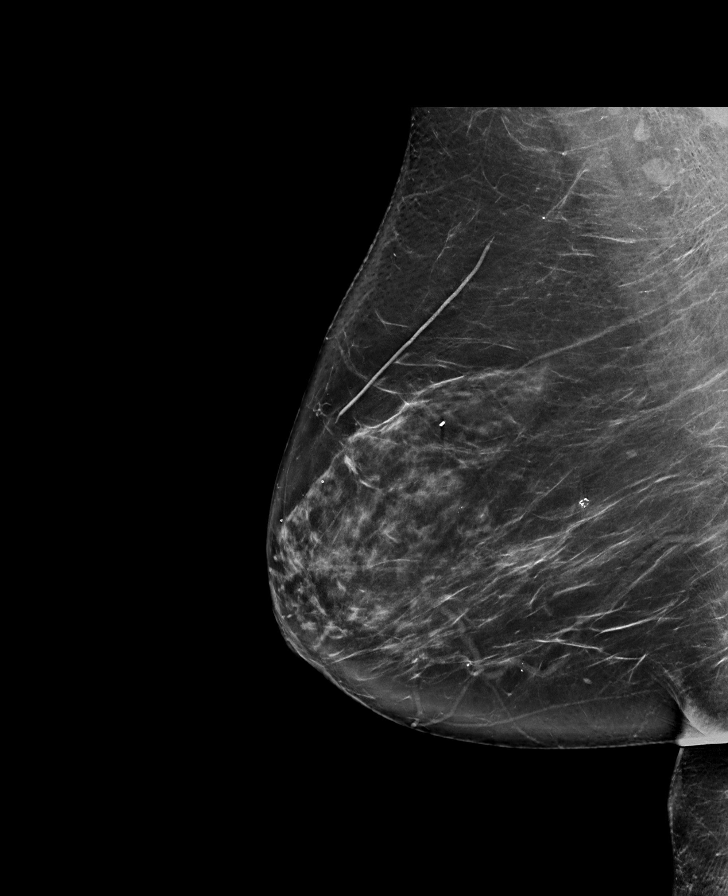

[L MLO synth-2D]
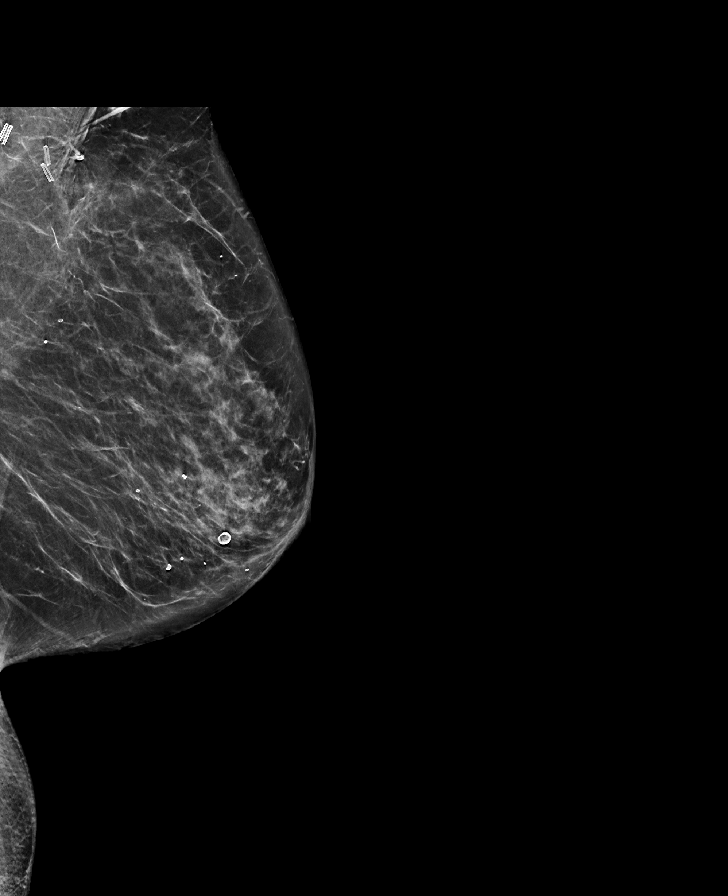

[R CC synth-2D]
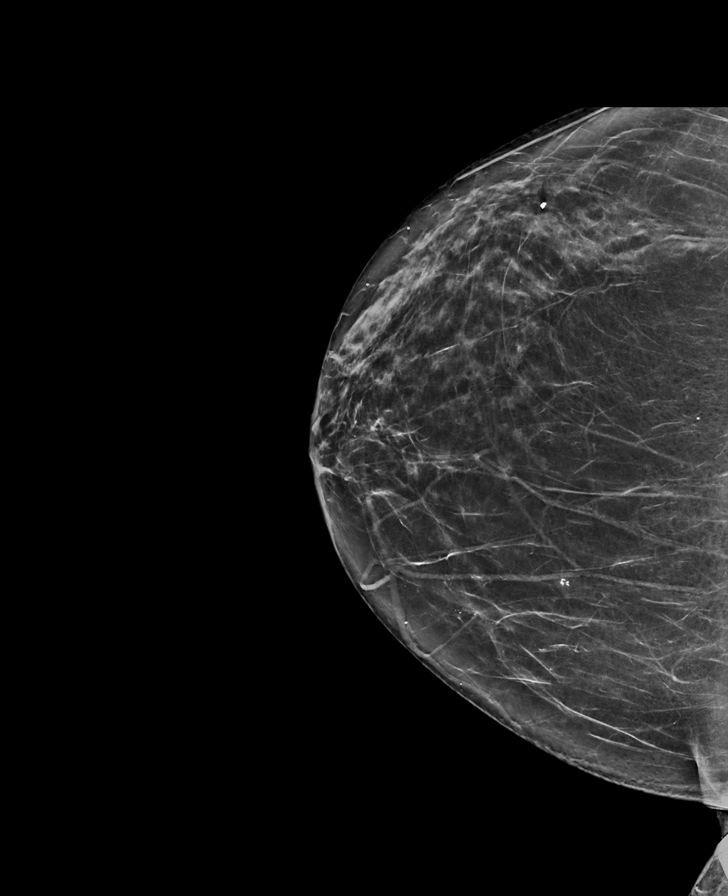

[L CC synth-2D]
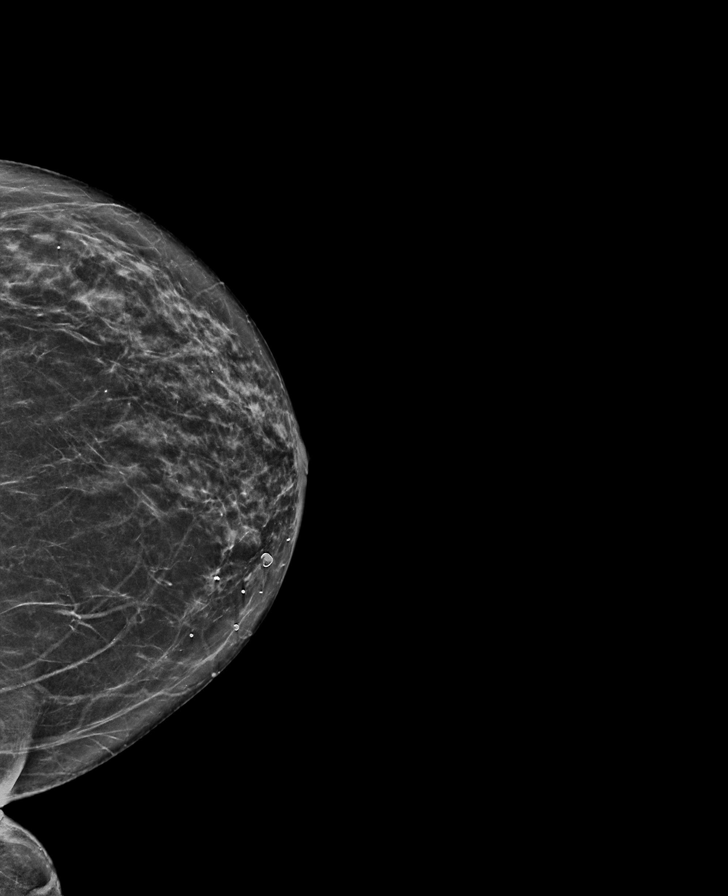

[R CC tomo · tomo slice 38/75.0]
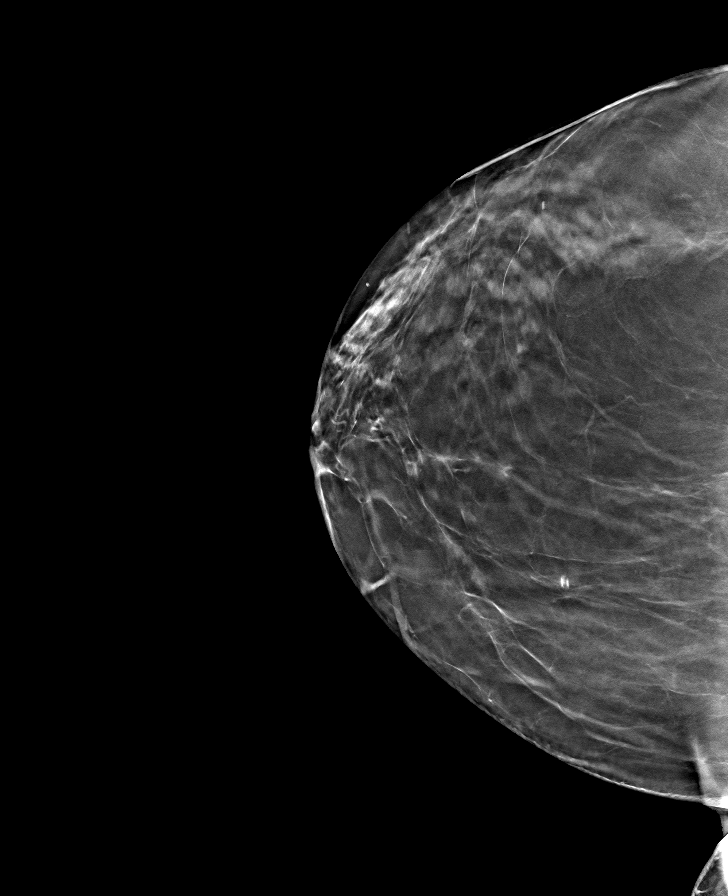

[R MLO tomo · tomo slice 45/89.0]
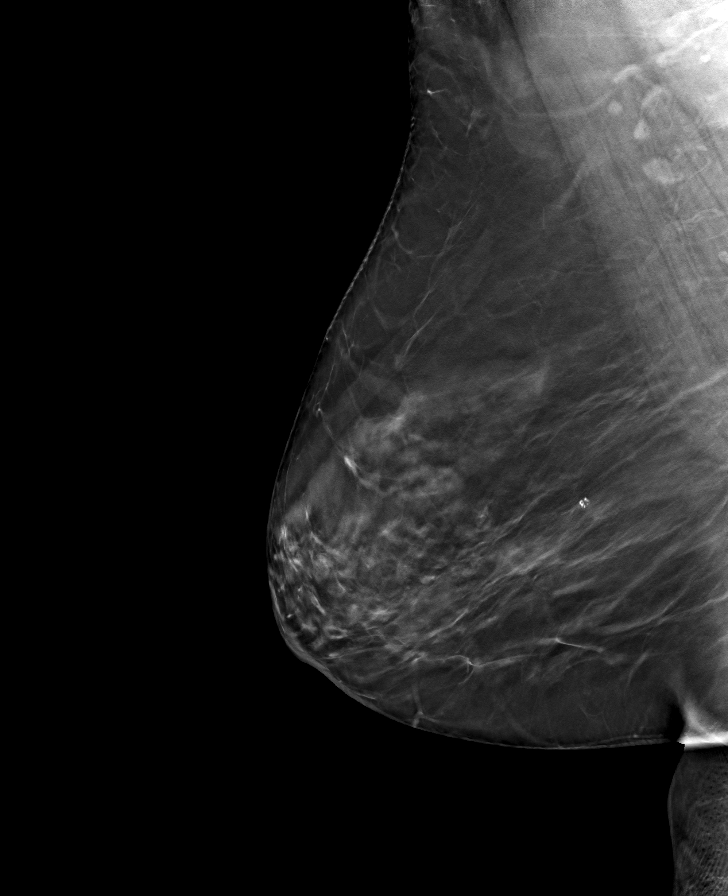

[L MLO tomo · tomo slice 38/75.0]
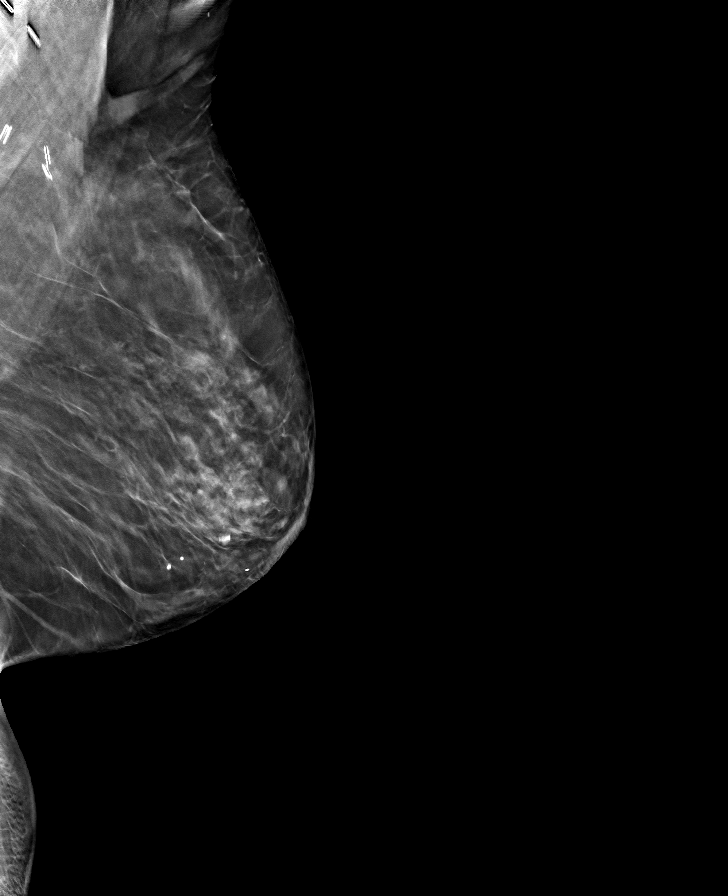

[L CC tomo · tomo slice 33/64.0]
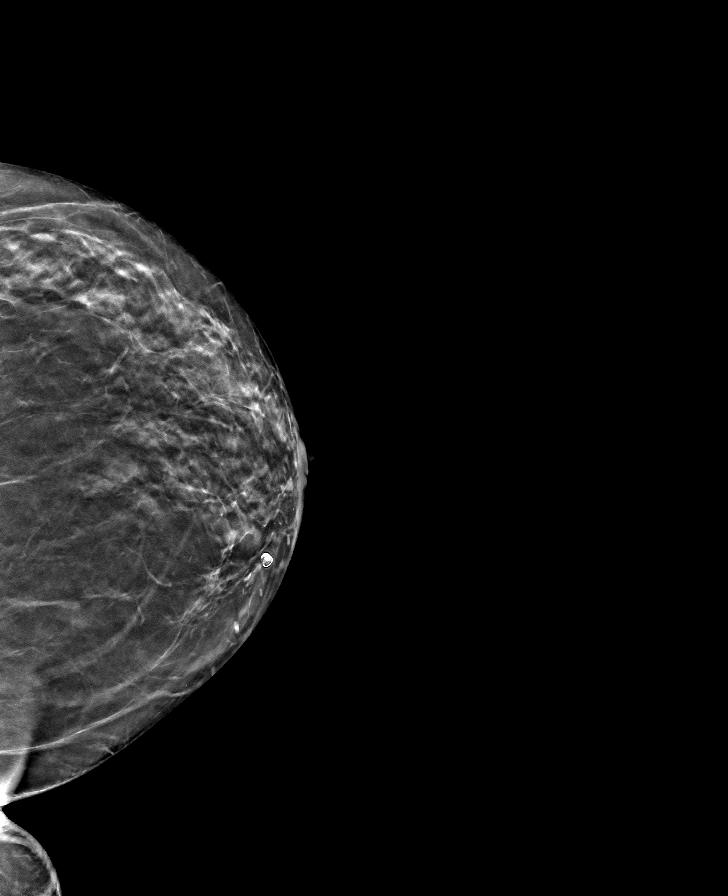

[8 of 24 positions shown; findings below may reference images not displayed]

ACR Breast Density Category b: There are scattered areas of
fibroglandular density.
FINDINGS: There are no findings suspicious for malignancy.
IMPRESSION: No mammographic evidence of malignancy. A result letter of this
screening mammogram will be mailed directly to the patient.

RECOMMENDATION:
Screening mammogram in one year. (Code:51-O-LD2)

BI-RADS CATEGORY  1: Negative.

## 2022-08-07 ENCOUNTER — Encounter: Payer: Self-pay | Admitting: *Deleted

## 2022-11-20 NOTE — Therapy (Unsigned)
OUTPATIENT PHYSICAL THERAPY FEMALE PELVIC EVALUATION   Patient Name: JOYCELENE Payne MRN: [id number] DOB:Dec 15, 1940, 82 y.o., female Today's Date: 11/21/2022  END OF SESSION:  PT End of Session - 11/21/22 0940     Visit Number 1    Date for PT Re-Evaluation 02/13/23    Authorization Type [employer]    PT Start Time 0930    PT Stop Time 1015    PT Time Calculation (min) 45 min    Activity Tolerance Patient tolerated treatment well    Behavior During Therapy [facility] for tasks assessed/performed             Past Medical History:  Diagnosis Date   Allergy    Arthritis    mild    Breast cancer ([facility]) 1994   left breast, lumpectomy/negative nodes/tamoxifen x 5 years   Endometrial polyp 08/2000   Hx of adenomatous colonic polyps 05/12/2014   Hyperlipidemia    slight- on statin three x a week    Insomnia    Kidney cysts 2003   benign, fatty cyst/angiomyolipoma   OAB (overactive bladder)    Osteopenia    Palpitations 07/01/2015   Personal history of radiation therapy    Vulvar abscess 03/1996   Past Surgical History:  Procedure Laterality Date   BREAST BIOPSY     right breast-benign growth   BREAST LUMPECTOMY  1994   left side,radiation also   CATARACT EXTRACTION, BILATERAL  2018   COLONOSCOPY     HYSTEROSCOPY  09/2000   IR RADIOLOGIST EVAL & MGMT  07/05/2021   IR RADIOLOGIST EVAL & MGMT  01/03/2022   POLYPECTOMY     TONSILLECTOMY AND ADENOIDECTOMY     Patient Active Problem List   Diagnosis Date Noted   Presence of pessary 02/13/2022   Acute left-sided low back pain without sciatica 02/13/2022   Female cystocele 01/06/2022   Palpitations 07/01/2015   Hx of adenomatous colonic polyps 05/12/2014   Pure hypercholesterolemia 01/30/2012   Malignant neoplasm of female breast ([facility]CC) 03/01/2009    PCP: Jasmine Corn, MD  REFERRING PROVIDER: Marguerita Beards, MD   REFERRING DIAG: N32.81 (ICD-10-CM) - Overactive bladder   THERAPY DIAG:  No diagnosis  found.  Rationale for Evaluation and Treatment: Rehabilitation  ONSET DATE: 2021  SUBJECTIVE:                                                                                                                                                                                           SUBJECTIVE STATEMENT: Currently using a ring pessary for her prolapse. I had to do a lot of straining due to the constipation. Patient  takes her pessary out every other day to wash it.  Fluid intake: Drinks: coffee, sips water all day long, drinks it at night as well.    PAIN:  Are you having pain? No  PRECAUTIONS: Other: cancer  WEIGHT BEARING RESTRICTIONS: No  FALLS:  Has patient fallen in last 6 months? No  LIVING ENVIRONMENT: Lives with: lives alone  OCCUPATION: Retired, swim 3 days per week and walks  PLOF: Independent  PATIENT GOALS: make pelvic floor stronger  PERTINENT HISTORY:  Breast cancer; Osteopenia;   BOWEL MOVEMENT: Pain with bowel movement: No Type of bowel movement:Type (Bristol Stool Scale) Type 4, Frequency 1 time per day, Strain Yes, and Splinting yes  in the past but not doing it now since she is having the miralax Fully empty rectum: No Leakage: No Fiber supplement: Yes: fiber and Miralax  daily  URINATION: Pain with urination: No Fully empty bladder: Yes:   Stream: Strong Urgency: Yes: when drinks coffee Frequency: Day time voids 6.  Nocturia: 3-6 times per night to void (drinks a lot of water at night)  Leakage:  leaks when has a full bladder , leaks 3-4 times per day, when she has to find a bathroom will leak Pads: Yes: liners/mini pads  INTERCOURSE: not active  PREGNANCY: Vaginal deliveries 2, spontaneous and forceps  PROLAPSE: Stage II anterior, Stage II posterior, Stage I apical prolapse   OBJECTIVE:   DIAGNOSTIC FINDINGS:  Pelvic floor strength I/V  PVR of 12 ml was obtained by bladder scan.   PATIENT SURVEYS:  PFIQ-7 157; UIQ-7: 62; CRAIQ-7 43;  POPIQ-7 52  COGNITION: Overall cognitive status: Within functional limits for tasks assessed     SENSATION: Light touch: Appears intact Proprioception: Appears intact   GAIT: Assistive device utilized: None Comments: no issues with gait  POSTURE: No Significant postural limitations  PELVIC ALIGNMENT: correct alignment  LUMBARAROM/PROM: Lumbar ROM is full    LOWER EXTREMITY ROM: bilateral hip ROM is full   LOWER EXTREMITY MMT: bilateral hip ROM is 5/5   PALPATION:   General  tightens the upper abdominal more than the lower abdominal,                 External Perineal Exam intact, good coloring                             Internal Pelvic Floor tightness along the introitus and perineal body  Patient confirms identification and approves PT to assess internal pelvic floor and treatment Yes  PELVIC MMT:   MMT eval  Vaginal 2/5 anterior and posterior; 1/5 laterally  (Blank rows = not tested)        TONE: average   TODAY'S TREATMENT:                                                                                                                              DATE: 11/21/22  EVAL See below   PATIENT EDUCATION:  Education details: educated patient on how to massage the walls of the introitus, how to apply the estrogen cream to the vaginal canal and vulvar area Person educated: Patient Education method: Explanation and Demonstration Education comprehension: verbalized understanding and returned demonstration  HOME EXERCISE PROGRAM: See above.   ASSESSMENT:  CLINICAL IMPRESSION: Patient is a 82 y.o. female who was seen today for physical therapy evaluation and treatment for overactive bladder. Patient has had issues with urinary leakage and urgency for several years. Patient was having issues with constipation. She has been straining and splinting for several years. Patient reports she has increased her fiber and taking Miralax which is helping with her bowel  movements. She is presently wearing a pessary to help with her Stage II anterior, Stage II posterior, and Stage I apical prolapse. Pelvic floor strength is 2/5 anterior and posterior and 1/5 laterally. She has tightness in the introitus and perineal body. Patient will leak when she has a full bladder and walks to the bathroom. She will wear a liner pad due to the leakage. Patient will benefit from skilled therapy to improve pelvic floor strength and education on prolapse management.   OBJECTIVE IMPAIRMENTS: decreased coordination, decreased strength, and increased fascial restrictions.   ACTIVITY LIMITATIONS: continence, toileting, and locomotion level  PARTICIPATION LIMITATIONS: cleaning, shopping, and community activity  PERSONAL FACTORS: Age, Time since onset of injury/illness/exacerbation, and 1-2 comorbidities: Breast cancer; Osteopenia;   are also affecting patient's functional outcome.   REHAB POTENTIAL: Excellent  CLINICAL DECISION MAKING: Stable/uncomplicated  EVALUATION COMPLEXITY: Low   GOALS: Goals reviewed with patient? Yes  SHORT TERM GOALS: Target date: 12/18/22  Patient educated on self perineal massage to improve tissue mobility.  Baseline: Goal status: INITIAL  2.  Patient independent with initial HEP for diaphragmatic breathing and pelvic floor strength.  Baseline:  Goal status: INITIAL   LONG TERM GOALS: Target date: 02/13/23  Patient independent with advanced HEP for pelvic floor and coordination exercises.  Baseline:  Goal status: INITIAL  2.  Patient is able to walk to the bathroom after she has the urge with a full bladder without leaking urine.  Baseline:  Goal status: INITIAL  3.  Pelvic floor strength >/= 3/5 with circular hug and lift to support organs and reduce leakage.  Baseline:  Goal status: INITIAL  4.  Patient educated on prolapse management to reduce the strain on the pelvic floor.  Baseline:  Goal status: INITIAL  5.  Patient able to  demonstrate correct toileting technique to reduce straining when having a bowel movement.  Baseline:  Goal status: INITIAL  PLAN:  PT FREQUENCY: 1x/week  PT DURATION: 12 weeks  PLANNED INTERVENTIONS: Therapeutic exercises, Therapeutic activity, Neuromuscular re-education, Patient/Family education, Dry Needling, Electrical stimulation, Biofeedback, and Manual therapy  PLAN FOR NEXT SESSION: manual work to the pelvic floor to work on pelvic elongation, diaphragmatic breathing   Eulis Foster, PT 11/21/22 12:59 PM

## 2022-11-21 ENCOUNTER — Encounter: Payer: Self-pay | Admitting: Physical Therapy

## 2022-11-21 ENCOUNTER — Encounter: Payer: Medicare PPO | Attending: Obstetrics and Gynecology | Admitting: Physical Therapy

## 2022-11-21 ENCOUNTER — Other Ambulatory Visit: Payer: Self-pay

## 2022-11-21 DIAGNOSIS — M6281 Muscle weakness (generalized): Secondary | ICD-10-CM | POA: Insufficient documentation

## 2022-11-21 DIAGNOSIS — R278 Other lack of coordination: Secondary | ICD-10-CM | POA: Insufficient documentation

## 2022-11-23 ENCOUNTER — Other Ambulatory Visit: Payer: Self-pay | Admitting: Internal Medicine

## 2022-11-23 DIAGNOSIS — Z1231 Encounter for screening mammogram for malignant neoplasm of breast: Secondary | ICD-10-CM

## 2022-11-23 DIAGNOSIS — Z961 Presence of intraocular lens: Secondary | ICD-10-CM | POA: Diagnosis not present

## 2022-11-23 DIAGNOSIS — H5212 Myopia, left eye: Secondary | ICD-10-CM | POA: Diagnosis not present

## 2022-11-27 ENCOUNTER — Encounter (HOSPITAL_BASED_OUTPATIENT_CLINIC_OR_DEPARTMENT_OTHER): Payer: Self-pay | Admitting: Obstetrics & Gynecology

## 2022-11-27 ENCOUNTER — Ambulatory Visit (INDEPENDENT_AMBULATORY_CARE_PROVIDER_SITE_OTHER): Payer: Medicare PPO | Admitting: Obstetrics & Gynecology

## 2022-11-27 VITALS — BP 140/76 | HR 64 | Ht 65.5 in | Wt 173.2 lb

## 2022-11-27 DIAGNOSIS — N8110 Cystocele, unspecified: Secondary | ICD-10-CM | POA: Diagnosis not present

## 2022-11-27 DIAGNOSIS — R030 Elevated blood-pressure reading, without diagnosis of hypertension: Secondary | ICD-10-CM

## 2022-11-27 DIAGNOSIS — N393 Stress incontinence (female) (male): Secondary | ICD-10-CM

## 2022-11-27 DIAGNOSIS — N3281 Overactive bladder: Secondary | ICD-10-CM

## 2022-11-27 DIAGNOSIS — Z960 Presence of urogenital implants: Secondary | ICD-10-CM

## 2022-11-27 LAB — POCT URINALYSIS DIPSTICK
Bilirubin, UA: NEGATIVE
Glucose, UA: NEGATIVE
Ketones, UA: NEGATIVE
Nitrite, UA: NEGATIVE
Protein, UA: NEGATIVE
Spec Grav, UA: 1.01
Urobilinogen, UA: 0.2 U/dL
pH, UA: 6

## 2022-11-27 NOTE — Progress Notes (Signed)
GYNECOLOGY  VISIT  CC:   recheck, pessary use  HPI: 82 y.o. G22P2002 Divorced White or Caucasian female here for recheck.  Reports she was at the beach until the end of May and did have some urinary frequency right before she came home.  She has used AZO cranberry and increased water and symptoms have resolved.    She is able to remove her pessary.  She removes it and replaces it every other day.  Does not have odor.  Denies vaginal bleeding.  Has done PT as well.  Pelvic floor strength I/V.  Urine leakage is improved unless bladder is full and there is a lot of urgency.  Then she will leak some as well.  Is taking Miralax every night and eating beans every day and this has really helped with constipation.  She would like another pessary as she is very concerned about having vaginal odor.  Past Medical History:  Diagnosis Date   Allergy    Arthritis    mild    Breast cancer ([facility]) 1994   left breast, lumpectomy/negative nodes/tamoxifen x 5 years   Endometrial polyp 08/2000   Hx of adenomatous colonic polyps 05/12/2014   Hyperlipidemia    slight- on statin three x a week    Insomnia    Kidney cysts 2003   benign, fatty cyst/angiomyolipoma   OAB (overactive bladder)    Osteopenia    Palpitations 07/01/2015   Personal history of radiation therapy    Vulvar abscess 03/1996    MEDS:   Current Outpatient Medications on File Prior to Visit  Medication Sig Dispense Refill   celecoxib (CELEBREX) 50 MG capsule Take 50 mg by mouth as needed for pain.     estradiol (ESTRACE) 0.1 MG/GM vaginal cream Place 0.5 g vaginally 2 (two) times a week. Place 0.5g nightly for two weeks then twice a week after 30 g 11   gabapentin (NEURONTIN) 100 MG capsule Take 100 mg by mouth daily.     RESTASIS 0.05 % ophthalmic emulsion      rosuvastatin (CRESTOR) 5 MG tablet TAKE 1 TABLET BY MOUTH 3 TIMES A WEEK. NEEDS OFFICE VISIT 12 tablet 0   No current facility-administered medications on file prior to  visit.    ALLERGIES: Codeine  SH:  divorced, non smoker  Review of Systems  Constitutional: Negative.   Genitourinary: Negative.     PHYSICAL EXAMINATION:    BP (!) 140/76   Pulse 64   Ht 5' 5.5" (1.664 m) Comment: Reported  Wt 173 lb 3.2 oz (78.6 kg)   LMP 06/19/1992   BMI 28.38 kg/m     General appearance: alert, cooperative and appears stated age Lymph:  no inguinal LAD noted  Pelvic: External genitalia:  no lesions              Urethra:  normal appearing urethra with no masses, tenderness or lesions              Bartholins and Skenes: normal                 Vagina: normal appearing vagina with normal color and discharge, no lesions, cystocele noted today, pessary removed and cleansed and then replaced.              Cervix: no lesions              Bimanual Exam:  Uterus:  normal size, contour, position, consistency, mobility, non-tender  Chaperone, Ina Homes, CMA, was present for exam.  Assessment/Plan: 1. Female cystocele - consider recheck 1 year - new pessary provided today  2. Elevated blood pressure reading - pt will monitor at home.  Does have BP cuff  3. Presence of pessary  4. Stress incontinence of urine  5. OAB (overactive bladder) - Urine Culture - POCT Urinalysis Dipstick

## 2022-11-28 ENCOUNTER — Encounter: Payer: Self-pay | Admitting: Physical Therapy

## 2022-11-28 ENCOUNTER — Encounter: Payer: Medicare PPO | Admitting: Physical Therapy

## 2022-11-28 DIAGNOSIS — R278 Other lack of coordination: Secondary | ICD-10-CM

## 2022-11-28 DIAGNOSIS — M6281 Muscle weakness (generalized): Secondary | ICD-10-CM | POA: Diagnosis not present

## 2022-11-28 LAB — URINE CULTURE

## 2022-11-28 NOTE — Therapy (Signed)
OUTPATIENT PHYSICAL THERAPY FEMALE PELVIC TREATMENT   Patient Name: Jasmine Payne MRN: [id number] DOB:March 28, 1941, 82 y.o., female Today's Date: 11/28/2022  END OF SESSION:  PT End of Session - 11/28/22 0933     Visit Number 2    Date for PT Re-Evaluation 02/13/23    Authorization Type [employer]    Authorization Time Period 11/21/22-02/13/23    Authorization - Visit Number 2    Authorization - Number of Visits 12    PT Start Time 0930    PT Stop Time 1015    PT Time Calculation (min) 45 min    Activity Tolerance Patient tolerated treatment well    Behavior During Therapy [facility] for tasks assessed/performed             Past Medical History:  Diagnosis Date   Allergy    Arthritis    mild    Breast cancer (HCC) 1994   left breast, lumpectomy/negative nodes/tamoxifen x 5 years   Endometrial polyp 08/2000   Hx of adenomatous colonic polyps 05/12/2014   Hyperlipidemia    slight- on statin three x a week    Insomnia    Kidney cysts 2003   benign, fatty cyst/angiomyolipoma   OAB (overactive bladder)    Osteopenia    Palpitations 07/01/2015   Personal history of radiation therapy    Vulvar abscess 03/1996   Past Surgical History:  Procedure Laterality Date   BREAST BIOPSY     right breast-benign growth   BREAST LUMPECTOMY  1994   left side,radiation also   CATARACT EXTRACTION, BILATERAL  2018   COLONOSCOPY     HYSTEROSCOPY  09/2000   IR RADIOLOGIST EVAL & MGMT  07/05/2021   IR RADIOLOGIST EVAL & MGMT  01/03/2022   POLYPECTOMY     TONSILLECTOMY AND ADENOIDECTOMY     Patient Active Problem List   Diagnosis Date Noted   Presence of pessary 02/13/2022   Acute left-sided low back pain without sciatica 02/13/2022   Female cystocele 01/06/2022   Palpitations 07/01/2015   Hx of adenomatous colonic polyps 05/12/2014   Pure hypercholesterolemia 01/30/2012   Malignant neoplasm of female breast (HCC) 03/01/2009    PCP: Creola Corn, MD  REFERRING PROVIDER: Marguerita Beards, MD   REFERRING DIAG: N32.81 (ICD-10-CM) - Overactive bladder   THERAPY DIAG:  Muscle weakness (generalized)  Other lack of coordination  Rationale for Evaluation and Treatment: Rehabilitation  ONSET DATE: 2021  SUBJECTIVE:  SUBJECTIVE STATEMENT: Currently using a ring pessary for her prolapse. I had to do a lot of straining due to the constipation. Patient takes her pessary out every other day to wash it.  Fluid intake: Drinks: coffee, sips water all day long, drinks it at night as well.    PAIN:  Are you having pain? No  PRECAUTIONS: Other: cancer  WEIGHT BEARING RESTRICTIONS: No  FALLS:  Has patient fallen in last 6 months? No  LIVING ENVIRONMENT: Lives with: lives alone  OCCUPATION: Retired, swim 3 days per week and walks  PLOF: Independent  PATIENT GOALS: make pelvic floor stronger  PERTINENT HISTORY:  Breast cancer; Osteopenia;   BOWEL MOVEMENT: Pain with bowel movement: No Type of bowel movement:Type (Bristol Stool Scale) Type 4, Frequency 1 time per day, Strain Yes, and Splinting yes  in the past but not doing it now since she is having the miralax Fully empty rectum: No Leakage: No Fiber supplement: Yes: fiber and Miralax  daily  URINATION: Pain with urination: No Fully empty bladder: Yes:   Stream: Strong Urgency: Yes: when drinks coffee Frequency: Day time voids 6.  Nocturia: 3-6 times per night to void (drinks a lot of water at night)  Leakage:  leaks when has a full bladder , leaks 3-4 times per day, when she has to find a bathroom will leak Pads: Yes: liners/mini pads  INTERCOURSE: not active  PREGNANCY: Vaginal deliveries 2, spontaneous and forceps  PROLAPSE: Stage II anterior, Stage II posterior, Stage I apical prolapse   OBJECTIVE:    DIAGNOSTIC FINDINGS:  Pelvic floor strength I/V  PVR of 12 ml was obtained by bladder scan.   PATIENT SURVEYS:  PFIQ-7 157; UIQ-7: 62; CRAIQ-7 43; POPIQ-7 52  COGNITION: Overall cognitive status: Within functional limits for tasks assessed     SENSATION: Light touch: Appears intact Proprioception: Appears intact   GAIT: Assistive device utilized: None Comments: no issues with gait  POSTURE: No Significant postural limitations  PELVIC ALIGNMENT: correct alignment  LUMBARAROM/PROM: Lumbar ROM is full    LOWER EXTREMITY ROM: bilateral hip ROM is full   LOWER EXTREMITY MMT: bilateral hip ROM is 5/5   PALPATION:   General  tightens the upper abdominal more than the lower abdominal,                 External Perineal Exam intact, good coloring                             Internal Pelvic Floor tightness along the introitus and perineal body  Patient confirms identification and approves PT to assess internal pelvic floor and treatment Yes  PELVIC MMT:   MMT eval  Vaginal 2/5 anterior and posterior; 1/5 laterally  (Blank rows = not tested)        TONE: average   TODAY'S TREATMENT:     11/28/22 Manual: Soft tissue mobilization:  Manual work on the diaphragm in supine and sidely Manual work to the obliques and rectus in supine Manual work on the lower rib intercostals, and quadratus in sidely Myofascial release: Suction cup to the lower rib cage and lower abdomen Tissue rolling around the lower rib cage and abdomen Release of the lower abdomen lifting tissue off the bladder and have her knees going side to side Neuromuscular re-education: Down training: Diaphragmatic breathing in sidely and sitting with tactile cues to the rib cage for expansion Exercises: Stretches/mobility: Sitting lateral trunk stretch with breath  Piriforms stretch in sitting holding 30 sec bil                                                                                                                              DATE: 11/21/22  EVAL See below  PATIENT EDUCATION: 11/28/22 Education details: Access Code: [email or web address] Person educated: Patient Education method: Explanation, Demonstration, Tactile cues, Verbal cues, and Handouts Education comprehension: verbalized understanding, returned demonstration, verbal cues required, tactile cues required, and needs further education  HOME EXERCISE PROGRAM: 11/28/22 Access Code: [email or web address] URL: [email or web address] Date: 11/28/2022 Prepared by: Eulis Foster  Exercises - Seated Diaphragmatic Breathing  - 1 x daily - 7 x weekly - 1 sets - 10 reps - Seated Lateral Trunk Stretch on Swiss Ball  - 1 x daily - 7 x weekly - 1 sets - 2 reps - 30 sec hold - Seated Piriformis Stretch with Trunk Bend  - 1 x daily - 7 x weekly - 1 sets - 2 reps - 30 sec hold  ASSESSMENT:  CLINICAL IMPRESSION: Patient is a 82 y.o. female who was seen today for physical therapy  treatment for overactive bladder.  Patient was able to perform diaphragmatic breathing and feel her pelvic floor relax. She had tightness in the lower abdomen putting pressure on the bladder. Patient felt like she had a UTI and is was checked yesterday. She did not have urinary urgency with the manual work done on the abdomen. Patient will benefit from skilled therapy to improve pelvic floor strength and education on prolapse management.   OBJECTIVE IMPAIRMENTS: decreased coordination, decreased strength, and increased fascial restrictions.   ACTIVITY LIMITATIONS: continence, toileting, and locomotion level  PARTICIPATION LIMITATIONS: cleaning, shopping, and community activity  PERSONAL FACTORS: Age, Time since onset of injury/illness/exacerbation, and 1-2 comorbidities: Breast cancer; Osteopenia;   are also affecting patient's functional outcome.   REHAB POTENTIAL: Excellent  CLINICAL DECISION MAKING: Stable/uncomplicated  EVALUATION COMPLEXITY: Low   GOALS: Goals  reviewed with patient? Yes  SHORT TERM GOALS: Target date: 12/18/22  Patient educated on self perineal massage to improve tissue mobility.  Baseline: Goal status: Met 11/28/22  2.  Patient independent with initial HEP for diaphragmatic breathing and pelvic floor strength.  Baseline:  Goal status: INITIAL   LONG TERM GOALS: Target date: 02/13/23  Patient independent with advanced HEP for pelvic floor and coordination exercises.  Baseline:  Goal status: INITIAL  2.  Patient is able to walk to the bathroom after she has the urge with a full bladder without leaking urine.  Baseline:  Goal status: INITIAL  3.  Pelvic floor strength >/= 3/5 with circular hug and lift to support organs and reduce leakage.  Baseline:  Goal status: INITIAL  4.  Patient educated on prolapse management to reduce the strain on the pelvic floor.  Baseline:  Goal status: INITIAL  5.  Patient able to demonstrate correct toileting technique to reduce straining when having a bowel movement.  Baseline:  Goal status: INITIAL  PLAN:  PT FREQUENCY: 1x/week  PT DURATION: 12 weeks  PLANNED INTERVENTIONS: Therapeutic exercises, Therapeutic activity, Neuromuscular re-education, Patient/Family education, Dry Needling, Electrical stimulation, Biofeedback, and Manual therapy  PLAN FOR NEXT SESSION: manual work to the pelvic floor to work on pelvic elongation, diaphragmatic breathing, education on toileting   Eulis Foster, PT 11/28/22 10:21 AM

## 2022-11-29 ENCOUNTER — Telehealth (HOSPITAL_BASED_OUTPATIENT_CLINIC_OR_DEPARTMENT_OTHER): Payer: Self-pay | Admitting: *Deleted

## 2022-11-29 NOTE — Telephone Encounter (Signed)
Pt called requesting medication to help with urinary frequency. She states that she has to get up multiple times a night. Advised that I would send her request to a provider and get back to her with recommendations.

## 2022-12-05 ENCOUNTER — Encounter: Payer: Self-pay | Admitting: Physical Therapy

## 2022-12-05 ENCOUNTER — Encounter: Payer: Medicare PPO | Admitting: Physical Therapy

## 2022-12-05 DIAGNOSIS — M6281 Muscle weakness (generalized): Secondary | ICD-10-CM | POA: Diagnosis not present

## 2022-12-05 DIAGNOSIS — R278 Other lack of coordination: Secondary | ICD-10-CM

## 2022-12-05 NOTE — Therapy (Signed)
OUTPATIENT PHYSICAL THERAPY FEMALE PELVIC TREATMENT   Patient Name: Jasmine Payne MRN: [id number] DOB:[birth year], 82 y.o., female Today's Date: 12/05/2022  END OF SESSION:  PT End of Session - 12/05/22 0934     Visit Number 3    Date for PT Re-Evaluation 02/13/23    Authorization Type [employer]    Authorization Time Period 11/21/22-02/13/23    Authorization - Visit Number 3    Authorization - Number of Visits 12    PT Start Time 0930    PT Stop Time 1015    PT Time Calculation (min) 45 min    Activity Tolerance Patient tolerated treatment well    Behavior During Therapy [facility] for tasks assessed/performed             Past Medical History:  Diagnosis Date   Allergy    Arthritis    mild    Breast cancer (HCC) 1994   left breast, lumpectomy/negative nodes/tamoxifen x 5 years   Endometrial polyp 08/2000   Hx of adenomatous colonic polyps 05/12/2014   Hyperlipidemia    slight- on statin three x a week    Insomnia    Kidney cysts 2003   benign, fatty cyst/angiomyolipoma   OAB (overactive bladder)    Osteopenia    Palpitations 07/01/2015   Personal history of radiation therapy    Vulvar abscess 03/1996   Past Surgical History:  Procedure Laterality Date   BREAST BIOPSY     right breast-benign growth   BREAST LUMPECTOMY  1994   left side,radiation also   CATARACT EXTRACTION, BILATERAL  2018   COLONOSCOPY     HYSTEROSCOPY  09/2000   IR RADIOLOGIST EVAL & MGMT  07/05/2021   IR RADIOLOGIST EVAL & MGMT  01/03/2022   POLYPECTOMY     TONSILLECTOMY AND ADENOIDECTOMY     Patient Active Problem List   Diagnosis Date Noted   Presence of pessary 02/13/2022   Acute left-sided low back pain without sciatica 02/13/2022   Female cystocele 01/06/2022   Palpitations 07/01/2015   Hx of adenomatous colonic polyps 05/12/2014   Pure hypercholesterolemia 01/30/2012   Malignant neoplasm of female breast (HCC) 03/01/2009    PCP: Creola Corn, MD  REFERRING PROVIDER: Marguerita Beards, MD   REFERRING DIAG: N32.81 (ICD-10-CM) - Overactive bladder   THERAPY DIAG:  Muscle weakness (generalized)  Other lack of coordination  Rationale for Evaluation and Treatment: Rehabilitation  ONSET DATE: 2021  SUBJECTIVE:  SUBJECTIVE STATEMENT: I  do not have a UTI. I hurt my back picking up a rug. I am not going as often. I am drinking a whole glass of water instead of sips.  Fluid intake: Drinks: coffee, sips water all day long, drinks it at night as well.    PAIN:  Are you having pain? No  PRECAUTIONS: Other: cancer  WEIGHT BEARING RESTRICTIONS: No  FALLS:  Has patient fallen in last 6 months? No  LIVING ENVIRONMENT: Lives with: lives alone  OCCUPATION: Retired, swim 3 days per week and walks  PLOF: Independent  PATIENT GOALS: make pelvic floor stronger  PERTINENT HISTORY:  Breast cancer; Osteopenia;   BOWEL MOVEMENT: Pain with bowel movement: No Type of bowel movement:Type (Bristol Stool Scale) Type 4, Frequency 1 time per day, Strain Yes, and Splinting yes  in the past but not doing it now since she is having the miralax Fully empty rectum: No Leakage: No Fiber supplement: Yes: fiber and Miralax  daily  URINATION: Pain with urination: No Fully empty bladder: Yes:   Stream: Strong Urgency: Yes: when drinks coffee Frequency: Day time voids 6.  Nocturia: 3-6 times per night to void (drinks a lot of water at night)  Leakage:  leaks when has a full bladder , leaks 3-4 times per day, when she has to find a bathroom will leak Pads: Yes: liners/mini pads  INTERCOURSE: not active  PREGNANCY: Vaginal deliveries 2, spontaneous and forceps  PROLAPSE: Stage II anterior, Stage II posterior, Stage I apical prolapse   OBJECTIVE:   DIAGNOSTIC FINDINGS:  Pelvic floor  strength I/V  PVR of 12 ml was obtained by bladder scan.   PATIENT SURVEYS:  PFIQ-7 157; UIQ-7: 62; CRAIQ-7 43; POPIQ-7 52  COGNITION: Overall cognitive status: Within functional limits for tasks assessed     SENSATION: Light touch: Appears intact Proprioception: Appears intact   GAIT: Assistive device utilized: None Comments: no issues with gait  POSTURE: No Significant postural limitations  PELVIC ALIGNMENT: correct alignment  LUMBARAROM/PROM: Lumbar ROM is full    LOWER EXTREMITY ROM: bilateral hip ROM is full   LOWER EXTREMITY MMT: bilateral hip ROM is 5/5   PALPATION:   General  tightens the upper abdominal more than the lower abdominal,                 External Perineal Exam intact, good coloring                             Internal Pelvic Floor tightness along the introitus and perineal body  Patient confirms identification and approves PT to assess internal pelvic floor and treatment Yes  PELVIC MMT:   MMT eval 12/05/22  Vaginal 2/5 anterior and posterior; 1/5 laterally 3/5 with circular hug and lift for 3 sec  (Blank rows = not tested)        TONE: average   TODAY'S TREATMENT:    12/05/22 Manual: Myofascial release: Fascial release of the urogenital diaphragm going through the layers Internal pelvic floor techniques: No emotional/communication barriers or cognitive limitation. Patient is motivated to learn. Patient understands and agrees with treatment goals and plan. PT explains patient will be examined in standing, sitting, and lying down to see how their muscles and joints work. When they are ready, they will be asked to remove their underwear so PT can examine their perineum. The patient is also given the option of providing their own chaperone as one is  not provided in our facility. The patient also has the right and is explained the right to defer or refuse any part of the evaluation or treatment including the internal exam. With the patient's  consent, PT will use one gloved finger to gently assess the muscles of the pelvic floor, seeing how well it contracts and relaxes and if there is muscle symmetry. After, the patient will get dressed and PT and patient will discuss exam findings and plan of care. PT and patient discuss plan of care, schedule, attendance policy and HEP activities. Going through the vaginal performing manual work to the introitus, puborectalis, pubovaginalis, perineal body  Exercises: Strengthening: Bridges with pelvic floor contraction and ball squeeze 10 x Therapist finger in the vaginal canal to work on contraction with taping of the introitus 10 x Therapist finger in the vaginal canal giving tactile cues to  muscles with small heel slides   11/28/22 Manual: Soft tissue mobilization:  Manual work on the diaphragm in supine and sidely Manual work to the obliques and rectus in supine Manual work on the lower rib intercostals, and quadratus in sidely Myofascial release: Suction cup to the lower rib cage and lower abdomen Tissue rolling around the lower rib cage and abdomen Release of the lower abdomen lifting tissue off the bladder and have her knees going side to side Neuromuscular re-education: Down training: Diaphragmatic breathing in sidely and sitting with tactile cues to the rib cage for expansion Exercises: Stretches/mobility: Sitting lateral trunk stretch with breath Piriforms stretch in sitting holding 30 sec bil                                                                                                                             DATE: 11/21/22  EVAL See below  PATIENT EDUCATION: 12/05/22 Education details: Access Code: [email or web address] Person educated: Patient Education method: Explanation, Demonstration, Tactile cues, Verbal cues, and Handouts Education comprehension: verbalized understanding, returned demonstration, verbal cues required, tactile cues required, and needs further education  HOME  EXERCISE PROGRAM: 12/05/22 Access Code: [email or web address] URL: [email or web address] Date: 12/05/2022 Prepared by: Eulis Foster  Exercises - Seated Diaphragmatic Breathing  - 1 x daily - 7 x weekly - 1 sets - 10 reps - Seated Lateral Trunk Stretch on Swiss Ball  - 1 x daily - 7 x weekly - 1 sets - 2 reps - 30 sec hold - Seated Piriformis Stretch with Trunk Bend  - 1 x daily - 7 x weekly - 1 sets - 2 reps - 30 sec hold - Supine Bridge with Mini Swiss Ball Between Knees  - 1 x daily - 3 x weekly - 1 sets - 10 reps - Supine Transversus Abdominis Bracing with Heel Slide  - 1 x daily - 3 x weekly - 1 sets - 10 reps ASSESSMENT:  CLINICAL IMPRESSION: Patient is a 82 y.o. female who was seen today for physical therapy  treatment for overactive bladder.  Patient getting up 3 times  per night instead of 6.  Pelvic floor strength increased to 3/5 with circular hug after manual and tactile work was done. She has difficulty with holding the contraction in supine with leg movement. Patient will benefit from skilled therapy to improve pelvic floor strength and education on prolapse management.   OBJECTIVE IMPAIRMENTS: decreased coordination, decreased strength, and increased fascial restrictions.   ACTIVITY LIMITATIONS: continence, toileting, and locomotion level  PARTICIPATION LIMITATIONS: cleaning, shopping, and community activity  PERSONAL FACTORS: Age, Time since onset of injury/illness/exacerbation, and 1-2 comorbidities: Breast cancer; Osteopenia;   are also affecting patient's functional outcome.   REHAB POTENTIAL: Excellent  CLINICAL DECISION MAKING: Stable/uncomplicated  EVALUATION COMPLEXITY: Low   GOALS: Goals reviewed with patient? Yes  SHORT TERM GOALS: Target date: 12/18/22  Patient educated on self perineal massage to improve tissue mobility.  Baseline: Goal status: Met 11/28/22  2.  Patient independent with initial HEP for diaphragmatic breathing and pelvic floor strength.   Baseline:  Goal status: INITIAL   LONG TERM GOALS: Target date: 02/13/23  Patient independent with advanced HEP for pelvic floor and coordination exercises.  Baseline:  Goal status: INITIAL  2.  Patient is able to walk to the bathroom after she has the urge with a full bladder without leaking urine.  Baseline:  Goal status: INITIAL  3.  Pelvic floor strength >/= 3/5 with circular hug and lift to support organs and reduce leakage.  Baseline: strength is 3/5 Goal status: ongoing 12/05/22  4.  Patient educated on prolapse management to reduce the strain on the pelvic floor.  Baseline:  Goal status: INITIAL  5.  Patient able to demonstrate correct toileting technique to reduce straining when having a bowel movement.  Baseline:  Goal status: INITIAL  PLAN:  PT FREQUENCY: 1x/week  PT DURATION: 12 weeks  PLANNED INTERVENTIONS: Therapeutic exercises, Therapeutic activity, Neuromuscular re-education, Patient/Family education, Dry Needling, Electrical stimulation, Biofeedback, and Manual therapy  PLAN FOR NEXT SESSION:  diaphragmatic breathing, education on toileting, progress HEP   Eulis Foster, PT 12/05/22 10:24 AM

## 2022-12-08 ENCOUNTER — Other Ambulatory Visit (HOSPITAL_BASED_OUTPATIENT_CLINIC_OR_DEPARTMENT_OTHER): Payer: Self-pay | Admitting: Obstetrics & Gynecology

## 2022-12-08 MED ORDER — OXYBUTYNIN CHLORIDE ER 5 MG PO TB24: 5.0000 mg | ORAL_TABLET | Freq: Every day | ORAL | 2 refills | Status: DC

## 2022-12-08 NOTE — Telephone Encounter (Signed)
Called pt to let her know lease let pt know that Dr. Hyacinth Meeker sent in rx for oxybutinyn 5mg  XL.  She had trouble finding one that is covered with her insurance.  This one can cause dry eyes, dry mouth and constipation but the XL dosing typically helps that. Advised that she started her on the lowest dosage at 5mg .  If, after 1 month, there isn't a lot of improvement and she is not having side effects, we can increase the dosage.  Pt confirms that she does not have glaucoma

## 2022-12-12 ENCOUNTER — Encounter: Payer: Medicare PPO | Admitting: Physical Therapy

## 2022-12-12 ENCOUNTER — Encounter: Payer: Self-pay | Admitting: Physical Therapy

## 2022-12-12 DIAGNOSIS — R278 Other lack of coordination: Secondary | ICD-10-CM

## 2022-12-12 DIAGNOSIS — M6281 Muscle weakness (generalized): Secondary | ICD-10-CM

## 2022-12-12 NOTE — Therapy (Addendum)
OUTPATIENT PHYSICAL THERAPY FEMALE PELVIC TREATMENT   Patient Name: Jasmine Payne MRN: [id number] DOB:Feb 03, 1941, 82 y.o., female Today's Date: 12/12/2022  END OF SESSION:  PT End of Session - 12/12/22 0933     Visit Number 4    Date for PT Re-Evaluation 02/13/23    Authorization Type [employer]    Authorization Time Period 11/21/22-02/13/23    Authorization - Visit Number 4    Authorization - Number of Visits 12    Progress Note Due on Visit 10    PT Start Time 0930    PT Stop Time 1015    PT Time Calculation (min) 45 min    Activity Tolerance Patient tolerated treatment well    Behavior During Therapy [facility] for tasks assessed/performed             Past Medical History:  Diagnosis Date   Allergy    Arthritis    mild    Breast cancer (HCC) 1994   left breast, lumpectomy/negative nodes/tamoxifen x 5 years   Endometrial polyp 08/2000   Hx of adenomatous colonic polyps 05/12/2014   Hyperlipidemia    slight- on statin three x a week    Insomnia    Kidney cysts 2003   benign, fatty cyst/angiomyolipoma   OAB (overactive bladder)    Osteopenia    Palpitations 07/01/2015   Personal history of radiation therapy    Vulvar abscess 03/1996   Past Surgical History:  Procedure Laterality Date   BREAST BIOPSY     right breast-benign growth   BREAST LUMPECTOMY  1994   left side,radiation also   CATARACT EXTRACTION, BILATERAL  2018   COLONOSCOPY     HYSTEROSCOPY  09/2000   IR RADIOLOGIST EVAL & MGMT  07/05/2021   IR RADIOLOGIST EVAL & MGMT  01/03/2022   POLYPECTOMY     TONSILLECTOMY AND ADENOIDECTOMY     Patient Active Problem List   Diagnosis Date Noted   Presence of pessary 02/13/2022   Acute left-sided low back pain without sciatica 02/13/2022   Female cystocele 01/06/2022   Palpitations 07/01/2015   Hx of adenomatous colonic polyps 05/12/2014   Pure hypercholesterolemia 01/30/2012   Malignant neoplasm of female breast (HCC) 03/01/2009    PCP: Creola Corn,  MD  REFERRING PROVIDER: Marguerita Beards, MD   REFERRING DIAG: N32.81 (ICD-10-CM) - Overactive bladder   THERAPY DIAG:  Muscle weakness (generalized)  Other lack of coordination  Rationale for Evaluation and Treatment: Rehabilitation  ONSET DATE: 2021  SUBJECTIVE:  SUBJECTIVE STATEMENT: I am not having leakage. I need to keep the pessary. I feel better with it in. I will leak when the bladder is very full and walking to the bathroom.    PAIN:  Are you having pain? No  PRECAUTIONS: Other: cancer  WEIGHT BEARING RESTRICTIONS: No  FALLS:  Has patient fallen in last 6 months? No  LIVING ENVIRONMENT: Lives with: lives alone  OCCUPATION: Retired, swim 3 days per week and walks  PLOF: Independent  PATIENT GOALS: make pelvic floor stronger  PERTINENT HISTORY:  Breast cancer; Osteopenia;   BOWEL MOVEMENT: Pain with bowel movement: No Type of bowel movement:Type (Bristol Stool Scale) Type 4, Frequency 1 time per day, Strain Yes, and Splinting yes  in the past but not doing it now since she is having the miralax Fully empty rectum: No Leakage: No Fiber supplement: Yes: fiber and Miralax  daily  URINATION: Pain with urination: No Fully empty bladder: Yes:   Stream: Strong Urgency: Yes: when drinks coffee Frequency: Day time voids 6.  Nocturia: 3-6 times per night to void (drinks a lot of water at night)  Leakage:  leaks when has a full bladder , leaks 3-4 times per day, when she has to find a bathroom will leak Pads: Yes: liners/mini pads  INTERCOURSE: not active  PREGNANCY: Vaginal deliveries 2, spontaneous and forceps  PROLAPSE: Stage II anterior, Stage II posterior, Stage I apical prolapse   OBJECTIVE:   DIAGNOSTIC FINDINGS:  Pelvic floor strength I/V  PVR of 12 ml  was obtained by bladder scan.   PATIENT SURVEYS:  PFIQ-7 157; UIQ-7: 62; CRAIQ-7 43; POPIQ-7 52  COGNITION: Overall cognitive status: Within functional limits for tasks assessed     SENSATION: Light touch: Appears intact Proprioception: Appears intact   GAIT: Assistive device utilized: None Comments: no issues with gait  POSTURE: No Significant postural limitations  PELVIC ALIGNMENT: correct alignment  LUMBARAROM/PROM: Lumbar ROM is full    LOWER EXTREMITY ROM: bilateral hip ROM is full   LOWER EXTREMITY MMT: bilateral hip ROM is 5/5   PALPATION:   General  tightens the upper abdominal more than the lower abdominal,                 External Perineal Exam intact, good coloring                             Internal Pelvic Floor tightness along the introitus and perineal body  Patient confirms identification and approves PT to assess internal pelvic floor and treatment Yes  PELVIC MMT:   MMT eval 12/05/22  Vaginal 2/5 anterior and posterior; 1/5 laterally 3/5 with circular hug and lift for 3 sec  (Blank rows = not tested)        TONE: average   TODAY'S TREATMENT:    12/12/22 Manual: Soft tissue mobilization: Manual work to the lumbar sacral region for alleviate low back pain with exercise Spinal mobilization: PA and side glide to L4 and L5 to improve mobility to reduce low back pain with exercise Exercises: Strengthening: Bridge with ball 15 x working on reducing lag cramps at same time Supine heel slides with ball squeeze with verbal cues to breath and contract the lower abdominals 15 x each side Standing bilateral shoulder extension with green band 15 X Standing against the wall with heel raises and pelvic floor contraction Therapeutic activities: Functional strengthening activities: Educated patient on how to have a bowel movement  with knees above hips, diaphragmatic breathing, breathing out to push the stool out and keeping relaxed.      12/05/22 Manual: Myofascial release: Fascial release of the urogenital diaphragm going through the layers Internal pelvic floor techniques: No emotional/communication barriers or cognitive limitation. Patient is motivated to learn. Patient understands and agrees with treatment goals and plan. PT explains patient will be examined in standing, sitting, and lying down to see how their muscles and joints work. When they are ready, they will be asked to remove their underwear so PT can examine their perineum. The patient is also given the option of providing their own chaperone as one is not provided in our facility. The patient also has the right and is explained the right to defer or refuse any part of the evaluation or treatment including the internal exam. With the patient's consent, PT will use one gloved finger to gently assess the muscles of the pelvic floor, seeing how well it contracts and relaxes and if there is muscle symmetry. After, the patient will get dressed and PT and patient will discuss exam findings and plan of care. PT and patient discuss plan of care, schedule, attendance policy and HEP activities. Going through the vaginal performing manual work to the introitus, puborectalis, pubovaginalis, perineal body  Exercises: Strengthening: Bridges with pelvic floor contraction and ball squeeze 10 x Therapist finger in the vaginal canal to work on contraction with taping of the introitus 10 x Therapist finger in the vaginal canal giving tactile cues to  muscles with small heel slides   11/28/22 Manual: Soft tissue mobilization:  Manual work on the diaphragm in supine and sidely Manual work to the obliques and rectus in supine Manual work on the lower rib intercostals, and quadratus in sidely Myofascial release: Suction cup to the lower rib cage and lower abdomen Tissue rolling around the lower rib cage and abdomen Release of the lower abdomen lifting tissue off the bladder and have  her knees going side to side Neuromuscular re-education: Down training: Diaphragmatic breathing in sidely and sitting with tactile cues to the rib cage for expansion Exercises: Stretches/mobility: Sitting lateral trunk stretch with breath Piriforms stretch in sitting holding 30 sec bil                                                                                                                              PATIENT EDUCATION: 12/12/22 Education details: Access Code: [email or web address] Person educated: Patient Education method: Explanation, Demonstration, Tactile cues, Verbal cues, and Handouts Education comprehension: verbalized understanding, returned demonstration, verbal cues required, tactile cues required, and needs further education  HOME EXERCISE PROGRAM: 12/12/22 Access Code: [email or web address] URL: [email or web address] Date: 12/12/2022 Prepared by: Eulis Foster  Exercises - Seated Diaphragmatic Breathing  - 1 x daily - 7 x weekly - 1 sets - 10 reps - Seated Lateral Trunk Stretch on Swiss Ball  - 1 x daily - 7 x weekly - 1 sets - 2 reps -  30 sec hold - Seated Piriformis Stretch with Trunk Bend  - 1 x daily - 7 x weekly - 1 sets - 2 reps - 30 sec hold - Supine Bridge with Mini Swiss Ball Between Knees  - 1 x daily - 3 x weekly - 1 sets - 10 reps - Supine Transversus Abdominis Bracing with Heel Slide  - 1 x daily - 3 x weekly - 1 sets - 10 reps - Standing Shoulder Extension with Resistance  - 1 x daily - 3 x weekly - 2 sets - 10 reps - Standing Pelvic Tilts with Heel Lift At Wall With Pelvic Floor Contraction  - 1 x daily - 7 x weekly - 1 sets - 10 reps ASSESSMENT:  CLINICAL IMPRESSION: Patient is a 82 y.o. female who was seen today for physical therapy  treatment for overactive bladder.  Patient gets up 3 times instead of 6 times. Patient is not pressing in the perineal area to have a bowel movement since she used the pessary and Miralax to soften the stool. Patient has reduction  of urinary leakage. Patient was educated on correct toleting technique for a bowel movement. Patient did not have lumbar pain with exercise after manual work. Patient will benefit from skilled therapy to improve pelvic floor strength and education on prolapse management.  OBJECTIVE IMPAIRMENTS: decreased coordination, decreased strength, and increased fascial restrictions.   ACTIVITY LIMITATIONS: continence, toileting, and locomotion level  PARTICIPATION LIMITATIONS: cleaning, shopping, and community activity  PERSONAL FACTORS: Age, Time since onset of injury/illness/exacerbation, and 1-2 comorbidities: Breast cancer; Osteopenia;   are also affecting patient's functional outcome.   REHAB POTENTIAL: Excellent  CLINICAL DECISION MAKING: Stable/uncomplicated  EVALUATION COMPLEXITY: Low   GOALS: Goals reviewed with patient? Yes  SHORT TERM GOALS: Target date: 12/18/22  Patient educated on self perineal massage to improve tissue mobility.  Baseline: Goal status: Met 11/28/22  2.  Patient independent with initial HEP for diaphragmatic breathing and pelvic floor strength.  Baseline:  Goal status: Met 12/12/22   LONG TERM GOALS: Target date: 02/13/23  Patient independent with advanced HEP for pelvic floor and coordination exercises.  Baseline:  Goal status: INITIAL  2.  Patient is able to walk to the bathroom after she has the urge with a full bladder without leaking urine.  Baseline:  Goal status: INITIAL  3.  Pelvic floor strength >/= 3/5 with circular hug and lift to support organs and reduce leakage.  Baseline: strength is 3/5 Goal status: ongoing 12/05/22  4.  Patient educated on prolapse management to reduce the strain on the pelvic floor.  Baseline:  Goal status: INITIAL  5.  Patient able to demonstrate correct toileting technique to reduce straining when having a bowel movement.  Baseline:  Goal status: Met 12/12/22  PLAN:  PT FREQUENCY: 1x/week  PT DURATION: 12  weeks  PLANNED INTERVENTIONS: Therapeutic exercises, Therapeutic activity, Neuromuscular re-education, Patient/Family education, Dry Needling, Electrical stimulation, Biofeedback, and Manual therapy  PLAN FOR NEXT SESSION:   progress HEP, prolapse management   Eulis Foster, PT 12/12/22 10:26 AM   PHYSICAL THERAPY DISCHARGE SUMMARY  Visits from Start of Care: 4  Current functional level related to goals / functional outcomes: See above. Patient called on 01/03/23 reporting she is doing well and does not need therapy and wants to be discharged.    Remaining deficits: See above.    Education / Equipment: HEP   Patient agrees to discharge. Patient goals were not met. Patient is being discharged due to  being pleased with the current functional level. Thank you for the referral. Eulis Foster, PT 01/03/23 8:49 AM

## 2022-12-14 ENCOUNTER — Ambulatory Visit
Admission: RE | Admit: 2022-12-14 | Discharge: 2022-12-14 | Disposition: A | Discharge status: Home / Self Care | Payer: Medicare PPO | Source: Ambulatory Visit | Attending: Internal Medicine | Admitting: Internal Medicine

## 2022-12-14 DIAGNOSIS — Z1231 Encounter for screening mammogram for malignant neoplasm of breast: Secondary | ICD-10-CM | POA: Diagnosis not present

## 2022-12-28 ENCOUNTER — Encounter: Payer: Self-pay | Admitting: Physical Therapy

## 2023-01-16 ENCOUNTER — Encounter: Payer: Self-pay | Admitting: Physical Therapy

## 2023-02-01 ENCOUNTER — Other Ambulatory Visit: Payer: Self-pay | Admitting: Interventional Radiology

## 2023-02-01 DIAGNOSIS — D1771 Benign lipomatous neoplasm of kidney: Secondary | ICD-10-CM

## 2023-02-12 ENCOUNTER — Ambulatory Visit (HOSPITAL_COMMUNITY)
Admission: RE | Admit: 2023-02-12 | Discharge: 2023-02-12 | Disposition: A | Discharge status: Home / Self Care | Payer: Medicare PPO | Source: Ambulatory Visit | Attending: Interventional Radiology | Admitting: Interventional Radiology

## 2023-02-12 ENCOUNTER — Encounter (HOSPITAL_COMMUNITY): Payer: Self-pay

## 2023-02-12 DIAGNOSIS — N281 Cyst of kidney, acquired: Secondary | ICD-10-CM | POA: Diagnosis not present

## 2023-02-12 DIAGNOSIS — D1771 Benign lipomatous neoplasm of kidney: Secondary | ICD-10-CM | POA: Insufficient documentation

## 2023-02-12 DIAGNOSIS — N838 Other noninflammatory disorders of ovary, fallopian tube and broad ligament: Secondary | ICD-10-CM | POA: Diagnosis not present

## 2023-02-12 DIAGNOSIS — N289 Disorder of kidney and ureter, unspecified: Secondary | ICD-10-CM | POA: Diagnosis not present

## 2023-02-12 LAB — POCT I-STAT CREATININE: Creatinine, Ser: 0.7 mg/dL (ref 0.44–1.00)

## 2023-02-12 MED ORDER — IOHEXOL 300 MG/ML  SOLN
100.0000 mL | Freq: Once | INTRAMUSCULAR | Status: AC | PRN
  Administered 2023-02-12: 100 mL via INTRAVENOUS

## 2023-02-15 ENCOUNTER — Telehealth (HOSPITAL_BASED_OUTPATIENT_CLINIC_OR_DEPARTMENT_OTHER): Payer: Self-pay | Admitting: *Deleted

## 2023-02-15 NOTE — Telephone Encounter (Signed)
Pt called stating that she has noticed some red spots along the scar from her breast surgery. She has tried using cortisone but has not had improvement. They are not tender. Pt provided with appt for evaluation.

## 2023-02-21 ENCOUNTER — Ambulatory Visit (HOSPITAL_BASED_OUTPATIENT_CLINIC_OR_DEPARTMENT_OTHER): Payer: Medicare PPO | Admitting: Obstetrics & Gynecology

## 2023-02-21 ENCOUNTER — Ambulatory Visit
Admission: RE | Admit: 2023-02-21 | Discharge: 2023-02-21 | Disposition: A | Discharge status: Home / Self Care | Payer: Medicare PPO | Source: Ambulatory Visit | Attending: Interventional Radiology | Admitting: Interventional Radiology

## 2023-02-21 VITALS — BP 134/77 | HR 75 | Ht 65.5 in | Wt 176.6 lb

## 2023-02-21 DIAGNOSIS — D1771 Benign lipomatous neoplasm of kidney: Secondary | ICD-10-CM

## 2023-02-21 DIAGNOSIS — R21 Rash and other nonspecific skin eruption: Secondary | ICD-10-CM | POA: Diagnosis not present

## 2023-02-21 HISTORY — PX: IR RADIOLOGIST EVAL & MGMT: IMG5224

## 2023-02-21 MED ORDER — TRIAMCINOLONE ACETONIDE 0.1 % EX CREA
1.0000 | TOPICAL_CREAM | Freq: Two times a day (BID) | CUTANEOUS | 0 refills | Status: AC
Start: 2023-02-21 — End: ?

## 2023-02-21 NOTE — Progress Notes (Signed)
Chief Complaint: Patient was consulted remotely today (TeleHealth) for follow-up of a left renal angiomyolipoma.    History of Present Illness: Jasmine Payne is a 82 y.o. female with a history of a known left upper pole renal angiomyolipoma since 2002.  After previous consultation, Ms. Huey elected to have a follow-up surveillance CT performed last year that demonstrated stable size of the renal AML.  We elected to perform another follow up CT in one year which was performed on 02/12/2023. She remains asymptomatic and has not had any left-sided abdominal pain or hematuria.  She remains healthy and quite active.   Past Medical History:  Diagnosis Date   Allergy    Arthritis    mild    Breast cancer (HCC) 1994   left breast, lumpectomy/negative nodes/tamoxifen x 5 years   Endometrial polyp 08/2000   Hx of adenomatous colonic polyps 05/12/2014   Hyperlipidemia    slight- on statin three x a week    Insomnia    Kidney cysts 2003   benign, fatty cyst/angiomyolipoma   OAB (overactive bladder)    Osteopenia    Palpitations 07/01/2015   Personal history of radiation therapy    Vulvar abscess 03/1996    Past Surgical History:  Procedure Laterality Date   BREAST BIOPSY     right breast-benign growth   BREAST LUMPECTOMY  1994   left side,radiation also   CATARACT EXTRACTION, BILATERAL  2018   COLONOSCOPY     HYSTEROSCOPY  09/2000   IR RADIOLOGIST EVAL & MGMT  07/05/2021   IR RADIOLOGIST EVAL & MGMT  01/03/2022   POLYPECTOMY     TONSILLECTOMY AND ADENOIDECTOMY      Allergies: Codeine  Medications: Prior to Admission medications   Medication Sig Start Date End Date Taking? Authorizing Provider  celecoxib (CELEBREX) 50 MG capsule Take 50 mg by mouth as needed for pain.    [provider]  estradiol (ESTRACE) 0.1 MG/GM vaginal cream Place 0.5 g vaginally 2 (two) times a week. Place 0.5g nightly for two weeks then twice a week after 07/13/22   Marguerita Beards,  MD  gabapentin (NEURONTIN) 100 MG capsule Take 100 mg by mouth daily.    [provider]  oxybutynin (DITROPAN-XL) 5 MG 24 hr tablet Take 1 tablet (5 mg total) by mouth at bedtime. 12/08/22   Jerene Bears, MD  RESTASIS 0.05 % ophthalmic emulsion  07/05/19   [provider]  rosuvastatin (CRESTOR) 5 MG tablet TAKE 1 TABLET BY MOUTH 3 TIMES A WEEK. NEEDS OFFICE VISIT 03/02/17   Chilton Si, MD     Family History  Problem Relation Age of Onset   Colon cancer Mother 55   Colon polyps Mother    Esophageal cancer Father    Heart attack Sister    Ovarian cancer Maternal Aunt    Irritable bowel syndrome Maternal Grandmother    Colon cancer Maternal Grandfather    Breast cancer Daughter    Rectal cancer Neg Hx    Stomach cancer Neg Hx     Social History   Socioeconomic History   Marital status: Divorced    Spouse name: Not on file   Number of children: Not on file   Years of education: Not on file   Highest education level: Not on file  Occupational History   Not on file  Tobacco Use   Smoking status: Former    Current packs/day: 0.00    Types: Cigarettes  Start date: 44    Quit date: 1982    Years since quitting: 42.7   Smokeless tobacco: Never  Vaping Use   Vaping status: Never Used  Substance and Sexual Activity   Alcohol use: Not Currently    Comment: not for many years   Drug use: Never   Sexual activity: Not Currently    Partners: Male    Birth control/protection: Post-menopausal  Other Topics Concern   Not on file  Social History Narrative   Not on file   Social Determinants of Health   Financial Resource Strain: Not on file  Food Insecurity: Not on file  Transportation Needs: Not on file  Physical Activity: Not on file  Stress: Not on file  Social Connections: Not on file    Review of Systems  Constitutional: Negative.   Respiratory: Negative.    Cardiovascular: Negative.   Gastrointestinal: Negative.   Genitourinary:  Negative.   Musculoskeletal: Negative.   Neurological: Negative.     Review of Systems: A 12 point ROS discussed and pertinent positives are indicated in the HPI above.  All other systems are negative.   Physical Exam No direct physical exam was performed (except for noted visual exam findings with Video Visits).   Vital Signs: LMP 06/19/1992   Imaging: CT ABDOMEN W WO CONTRAST  Result Date: 02/18/2023 CLINICAL DATA:  Renal angiomyolipoma. EXAM: CT ABDOMEN WITHOUT AND WITH CONTRAST TECHNIQUE: Multidetector CT imaging of the abdomen was performed following the standard protocol before and following the bolus administration of intravenous contrast. RADIATION DOSE REDUCTION: This exam was performed according to the departmental dose-optimization program which includes automated exposure control, adjustment of the mA and/or kV according to patient size and/or use of iterative reconstruction technique. CONTRAST:  OMNIPAQUE IOHEXOL 300 MG/ML  SOLN COMPARISON:  CT 12/27/2021 FINDINGS: Lower chest: There is some linear opacity lung bases likely scar or atelectasis. No pleural effusion. Hepatobiliary: Gallbladder is nondilated. No biliary ductal dilatation. Patent portal vein. There are several cysts seen throughout the liver. This includes some larger foci towards the dome such as segment 4 measuring 8.7 cm and segment 3 measuring 9.3 cm. Several smaller foci as well as scattered in the liver. Extent and distribution are similar to previous. Likely benign. No continued specific follow-up. Pancreas: Unremarkable. No pancreatic ductal dilatation or surrounding inflammatory changes. Spleen: Normal in size without focal abnormality. Adrenals/Urinary Tract: Adrenal glands are preserved. No abnormal calcifications are seen within either kidney. No enhancing right-sided renal mass. Both kidneys show a few tiny cystic foci which are under 5 mm and too small to completely characterize but likely benign cysts.  Bosniak 2. There is mid parenchymal lateral left-sided renal cyst measuring 16 mm with Hounsfield unit of 15, Bosniak 1. Of the upper pole on the left is a small hyperdense lesion as well measuring 5 mm in diameter Hounsfield unit 81, Bosniak 2 lesion. No specific follow up with a Bosniak 1 and 2 lesions. Once again there is a fat containing heterogeneous lesion along the upper pole left kidney extending anteriorly. Previously this has measured 4.6 x 3.3 cm and today proximally 4.6 by 3.1 cm, not significantly changed. Stomach/Bowel: Moderate stool seen in the visualized portions of the colon. Stomach and small bowel are nondilated. Vascular/Lymphatic: Aortic atherosclerosis. No enlarged abdominal lymph nodes. Continued enlargement of the left ovarian/gonadal vein. Please correlate with any symptoms. Other: No free air or free fluid. Musculoskeletal: Moderate mild degenerative changes of the spine. Slight curvature of the spine.  IMPRESSION: Stable 4.6 cm left-sided upper pole renal angiomyolipoma. Benign hepatic and renal cysts separate. Stable enlargement of the left ovarian vein. Electronically Signed   By: Karen Kays M.D.   On: 02/18/2023 11:16     Assessment and Plan:  I spoke with Ms. Insana over the phone. We reviewed the follow-up CT dated 02/12/2023. This demonstrates stable size, morphology and enhancement of a left upper pole renal angiomyolipoma measuring approximately 4.6 cm in greatest axial transverse diameter. Degree of vascularity of the angiomyolipoma remained stable and relatively low with no evidence of aneurysms.   I recommended a follow up CT in two years given stability of the AML since 2022. Ms. Walden is in agreement with this plan.   Electronically Signed: Reola Calkins 02/21/2023, 8:51 AM    I spent a total of  10 Minutes in remote  clinical consultation, greater than 50% of which was counseling/coordinating care for a left renal AML.    Visit type: Audio only  (telephone). Audio (no video) only due to patient's lack of internet/smartphone capability. Alternative for in-person consultation at [facility], [address] [place], [place], [place]. This visit type was conducted due to national recommendations for restrictions regarding the COVID-19 Pandemic (e.g. social distancing).  This format is felt to be most appropriate for this patient at this time.  All issues noted in this document were discussed and addressed.

## 2023-02-21 NOTE — Progress Notes (Signed)
GYNECOLOGY  VISIT  CC:   rash?  HPI: 82 y.o. G82P2002 Divorced White or Caucasian female here for complaint of rash that has been present for about a month.  This is primarily in her axilla and underneath her breasts.  It is not itchy.  Denies any changes in recent products.  Concerned related to hx of breast cancer.  Has most recent MMG 12/14/2022.  Has used some OTC cortisone cream.  Not sure has helped.   Past Medical History:  Diagnosis Date   Allergy    Arthritis    mild    Breast cancer (HCC) 1994   left breast, lumpectomy/negative nodes/tamoxifen x 5 years   Endometrial polyp 08/2000   Hx of adenomatous colonic polyps 05/12/2014   Hyperlipidemia    slight- on statin three x a week    Insomnia    Kidney cysts 2003   benign, fatty cyst/angiomyolipoma   OAB (overactive bladder)    Osteopenia    Palpitations 07/01/2015   Personal history of radiation therapy    Vulvar abscess 03/1996    MEDS:   Current Outpatient Medications on File Prior to Visit  Medication Sig Dispense Refill   celecoxib (CELEBREX) 50 MG capsule Take 50 mg by mouth as needed for pain.     estradiol (ESTRACE) 0.1 MG/GM vaginal cream Place 0.5 g vaginally 2 (two) times a week. Place 0.5g nightly for two weeks then twice a week after 30 g 11   gabapentin (NEURONTIN) 100 MG capsule Take 100 mg by mouth daily.     oxybutynin (DITROPAN-XL) 5 MG 24 hr tablet Take 1 tablet (5 mg total) by mouth at bedtime. 30 tablet 2   RESTASIS 0.05 % ophthalmic emulsion      rosuvastatin (CRESTOR) 5 MG tablet TAKE 1 TABLET BY MOUTH 3 TIMES A WEEK. NEEDS OFFICE VISIT 12 tablet 0   No current facility-administered medications on file prior to visit.    ALLERGIES: Codeine  SH:  divorced, non smoker  Review of Systems  Constitutional: Negative.   Genitourinary: Negative.     PHYSICAL EXAMINATION:    BP 134/77 (BP Location: Right Arm, Patient Position: Sitting, Cuff Size: Normal)   Pulse 75   Ht 5' 5.5" (1.664 m)   Wt  176 lb 9.6 oz (80.1 kg)   LMP 06/19/1992   BMI 28.94 kg/m     Physical Exam Constitutional:      Appearance: Normal appearance.  Skin:    General: Skin is warm.     Findings: Rash (erythematous patchy located underneath breasts across chest wall and in axilla.  Image take of axillary findings.) present.  Neurological:     General: No focal deficit present.     Mental Status: She is alert.  Psychiatric:        Mood and Affect: Mood normal.        Assessment/Plan: 1. Skin rash - offered skin biopsy today.  Pt reassured that I do not feel this is related to prior hx of breast cancer.  - will start with steroid cream BID for up to 14 days.  If no better, will plan biopsy or have pt see her dermatologist, Dr. Lovenia Kim.  Pt comfortable with plan. - triamcinolone cream (KENALOG) 0.1 %; Apply 1 Application topically 2 (two) times daily.  Dispense: 30 g; Refill: 0

## 2023-02-23 DIAGNOSIS — M8580 Other specified disorders of bone density and structure, unspecified site: Secondary | ICD-10-CM | POA: Diagnosis not present

## 2023-02-23 DIAGNOSIS — E785 Hyperlipidemia, unspecified: Secondary | ICD-10-CM | POA: Diagnosis not present

## 2023-02-23 DIAGNOSIS — R7989 Other specified abnormal findings of blood chemistry: Secondary | ICD-10-CM | POA: Diagnosis not present

## 2023-03-02 DIAGNOSIS — Z0000 Encounter for general adult medical examination without abnormal findings: Secondary | ICD-10-CM | POA: Diagnosis not present

## 2023-03-02 DIAGNOSIS — Z23 Encounter for immunization: Secondary | ICD-10-CM | POA: Diagnosis not present

## 2023-03-02 DIAGNOSIS — M8580 Other specified disorders of bone density and structure, unspecified site: Secondary | ICD-10-CM | POA: Diagnosis not present

## 2023-03-02 DIAGNOSIS — D1771 Benign lipomatous neoplasm of kidney: Secondary | ICD-10-CM | POA: Diagnosis not present

## 2023-03-02 DIAGNOSIS — N318 Other neuromuscular dysfunction of bladder: Secondary | ICD-10-CM | POA: Diagnosis not present

## 2023-03-02 DIAGNOSIS — F418 Other specified anxiety disorders: Secondary | ICD-10-CM | POA: Diagnosis not present

## 2023-03-02 DIAGNOSIS — E785 Hyperlipidemia, unspecified: Secondary | ICD-10-CM | POA: Diagnosis not present

## 2023-03-02 DIAGNOSIS — C4491 Basal cell carcinoma of skin, unspecified: Secondary | ICD-10-CM | POA: Diagnosis not present

## 2023-03-02 DIAGNOSIS — I700 Atherosclerosis of aorta: Secondary | ICD-10-CM | POA: Diagnosis not present

## 2023-03-02 DIAGNOSIS — Z853 Personal history of malignant neoplasm of breast: Secondary | ICD-10-CM | POA: Diagnosis not present

## 2023-03-03 ENCOUNTER — Other Ambulatory Visit (HOSPITAL_BASED_OUTPATIENT_CLINIC_OR_DEPARTMENT_OTHER): Payer: Self-pay | Admitting: Obstetrics & Gynecology

## 2023-06-29 DIAGNOSIS — N390 Urinary tract infection, site not specified: Secondary | ICD-10-CM | POA: Diagnosis not present

## 2023-08-14 DIAGNOSIS — N2889 Other specified disorders of kidney and ureter: Secondary | ICD-10-CM | POA: Diagnosis not present

## 2023-08-14 DIAGNOSIS — R030 Elevated blood-pressure reading, without diagnosis of hypertension: Secondary | ICD-10-CM | POA: Diagnosis not present

## 2023-08-14 DIAGNOSIS — M545 Low back pain, unspecified: Secondary | ICD-10-CM | POA: Diagnosis not present

## 2023-08-14 DIAGNOSIS — Z853 Personal history of malignant neoplasm of breast: Secondary | ICD-10-CM | POA: Diagnosis not present

## 2023-08-14 DIAGNOSIS — G4700 Insomnia, unspecified: Secondary | ICD-10-CM | POA: Diagnosis not present

## 2023-08-14 DIAGNOSIS — F418 Other specified anxiety disorders: Secondary | ICD-10-CM | POA: Diagnosis not present

## 2023-08-14 DIAGNOSIS — D1771 Benign lipomatous neoplasm of kidney: Secondary | ICD-10-CM | POA: Diagnosis not present

## 2023-08-14 DIAGNOSIS — I700 Atherosclerosis of aorta: Secondary | ICD-10-CM | POA: Diagnosis not present

## 2023-11-13 DIAGNOSIS — L814 Other melanin hyperpigmentation: Secondary | ICD-10-CM | POA: Diagnosis not present

## 2023-11-13 DIAGNOSIS — L438 Other lichen planus: Secondary | ICD-10-CM | POA: Diagnosis not present

## 2023-11-13 DIAGNOSIS — I788 Other diseases of capillaries: Secondary | ICD-10-CM | POA: Diagnosis not present

## 2023-11-13 DIAGNOSIS — D1801 Hemangioma of skin and subcutaneous tissue: Secondary | ICD-10-CM | POA: Diagnosis not present

## 2023-11-13 DIAGNOSIS — L738 Other specified follicular disorders: Secondary | ICD-10-CM | POA: Diagnosis not present

## 2023-11-13 DIAGNOSIS — B078 Other viral warts: Secondary | ICD-10-CM | POA: Diagnosis not present

## 2023-11-13 DIAGNOSIS — L439 Lichen planus, unspecified: Secondary | ICD-10-CM | POA: Diagnosis not present

## 2023-11-13 DIAGNOSIS — Z85828 Personal history of other malignant neoplasm of skin: Secondary | ICD-10-CM | POA: Diagnosis not present

## 2023-11-13 DIAGNOSIS — L720 Epidermal cyst: Secondary | ICD-10-CM | POA: Diagnosis not present

## 2023-11-13 DIAGNOSIS — L570 Actinic keratosis: Secondary | ICD-10-CM | POA: Diagnosis not present

## 2023-11-15 ENCOUNTER — Other Ambulatory Visit: Payer: Self-pay | Admitting: Internal Medicine

## 2023-11-15 DIAGNOSIS — Z1231 Encounter for screening mammogram for malignant neoplasm of breast: Secondary | ICD-10-CM

## 2023-11-26 DIAGNOSIS — H52203 Unspecified astigmatism, bilateral: Secondary | ICD-10-CM | POA: Diagnosis not present

## 2023-11-26 DIAGNOSIS — Z961 Presence of intraocular lens: Secondary | ICD-10-CM | POA: Diagnosis not present

## 2023-12-17 ENCOUNTER — Ambulatory Visit
Admission: RE | Admit: 2023-12-17 | Discharge: 2023-12-17 | Disposition: A | Discharge status: Home / Self Care | Source: Ambulatory Visit | Attending: Internal Medicine | Admitting: Internal Medicine

## 2023-12-17 DIAGNOSIS — Z1231 Encounter for screening mammogram for malignant neoplasm of breast: Secondary | ICD-10-CM | POA: Diagnosis not present

## 2023-12-18 ENCOUNTER — Telehealth (HOSPITAL_BASED_OUTPATIENT_CLINIC_OR_DEPARTMENT_OTHER): Payer: Self-pay | Admitting: *Deleted

## 2023-12-18 MED ORDER — VALACYCLOVIR HCL 500 MG PO TABS: 500.0000 mg | ORAL_TABLET | Freq: Two times a day (BID) | ORAL | 0 refills | Status: AC

## 2023-12-18 NOTE — Telephone Encounter (Signed)
 TC from pt. Needs refill on Valcyclovir. I called patient back.  DOB verified.  Pt has not had outbreak in a long time.  Rx at home is expired.  Refill ok per Arland Roller.  Pt saw Dr Cleotilde 02/2023. Pharmacy verified.  Valcyclovir #30 take 1 tab twice a day for 5 days per outbreak.  [place] CMA

## 2024-02-25 DIAGNOSIS — E785 Hyperlipidemia, unspecified: Secondary | ICD-10-CM | POA: Diagnosis not present

## 2024-02-25 DIAGNOSIS — E559 Vitamin D deficiency, unspecified: Secondary | ICD-10-CM | POA: Diagnosis not present

## 2024-02-25 DIAGNOSIS — Z1389 Encounter for screening for other disorder: Secondary | ICD-10-CM | POA: Diagnosis not present

## 2024-02-25 DIAGNOSIS — E7849 Other hyperlipidemia: Secondary | ICD-10-CM | POA: Diagnosis not present

## 2024-03-03 DIAGNOSIS — D1771 Benign lipomatous neoplasm of kidney: Secondary | ICD-10-CM | POA: Diagnosis not present

## 2024-03-03 DIAGNOSIS — E785 Hyperlipidemia, unspecified: Secondary | ICD-10-CM | POA: Diagnosis not present

## 2024-03-03 DIAGNOSIS — G4700 Insomnia, unspecified: Secondary | ICD-10-CM | POA: Diagnosis not present

## 2024-03-03 DIAGNOSIS — F418 Other specified anxiety disorders: Secondary | ICD-10-CM | POA: Diagnosis not present

## 2024-03-03 DIAGNOSIS — Z0000 Encounter for general adult medical examination without abnormal findings: Secondary | ICD-10-CM | POA: Diagnosis not present

## 2024-03-03 DIAGNOSIS — M792 Neuralgia and neuritis, unspecified: Secondary | ICD-10-CM | POA: Diagnosis not present

## 2024-03-03 DIAGNOSIS — R82998 Other abnormal findings in urine: Secondary | ICD-10-CM | POA: Diagnosis not present

## 2024-03-03 DIAGNOSIS — M8580 Other specified disorders of bone density and structure, unspecified site: Secondary | ICD-10-CM | POA: Diagnosis not present

## 2024-03-03 DIAGNOSIS — R030 Elevated blood-pressure reading, without diagnosis of hypertension: Secondary | ICD-10-CM | POA: Diagnosis not present
